# Patient Record
Sex: Male | Born: 1960 | Race: Black or African American | Hispanic: No | Marital: Married | Smoking: Current every day smoker
Health system: Southern US, Community
[De-identification: ages and names within clinical notes are randomized; demographics above are authoritative.]

## PROBLEM LIST (undated history)

## (undated) DIAGNOSIS — M549 Dorsalgia, unspecified: Secondary | ICD-10-CM

## (undated) DIAGNOSIS — G8929 Other chronic pain: Secondary | ICD-10-CM

## (undated) DIAGNOSIS — I10 Essential (primary) hypertension: Secondary | ICD-10-CM

## (undated) DIAGNOSIS — F1010 Alcohol abuse, uncomplicated: Secondary | ICD-10-CM

## (undated) DIAGNOSIS — M1990 Unspecified osteoarthritis, unspecified site: Secondary | ICD-10-CM

## (undated) DIAGNOSIS — Z9109 Other allergy status, other than to drugs and biological substances: Secondary | ICD-10-CM

## (undated) HISTORY — DX: Essential (primary) hypertension: I10

## (undated) HISTORY — PX: NO PAST SURGERIES: SHX2092

---

## 1999-08-19 ENCOUNTER — Ambulatory Visit (HOSPITAL_COMMUNITY): Admission: RE | Admit: 1999-08-19 | Discharge: 1999-08-19 | Payer: Self-pay | Admitting: Neurosurgery

## 1999-08-19 ENCOUNTER — Encounter: Payer: Self-pay | Admitting: Neurosurgery

## 1999-09-15 ENCOUNTER — Encounter: Payer: Self-pay | Admitting: Neurosurgery

## 1999-09-15 ENCOUNTER — Ambulatory Visit (HOSPITAL_COMMUNITY): Admission: RE | Admit: 1999-09-15 | Discharge: 1999-09-15 | Payer: Self-pay | Admitting: Neurosurgery

## 1999-09-29 ENCOUNTER — Encounter: Payer: Self-pay | Admitting: Neurosurgery

## 1999-09-29 ENCOUNTER — Ambulatory Visit (HOSPITAL_COMMUNITY): Admission: RE | Admit: 1999-09-29 | Discharge: 1999-09-29 | Payer: Self-pay | Admitting: Neurosurgery

## 1999-10-13 ENCOUNTER — Ambulatory Visit (HOSPITAL_COMMUNITY): Admission: RE | Admit: 1999-10-13 | Discharge: 1999-10-13 | Payer: Self-pay | Admitting: Neurosurgery

## 1999-10-13 ENCOUNTER — Encounter: Payer: Self-pay | Admitting: Neurosurgery

## 2010-04-13 ENCOUNTER — Emergency Department (HOSPITAL_COMMUNITY)
Admission: EM | Admit: 2010-04-13 | Discharge: 2010-04-13 | Payer: Self-pay | Source: Home / Self Care | Admitting: Emergency Medicine

## 2012-10-26 ENCOUNTER — Emergency Department (HOSPITAL_COMMUNITY)
Admission: EM | Admit: 2012-10-26 | Discharge: 2012-10-26 | Disposition: A | Discharge status: Home / Self Care | Payer: BC Managed Care – PPO | Attending: Emergency Medicine | Admitting: Emergency Medicine

## 2012-10-26 ENCOUNTER — Encounter (HOSPITAL_COMMUNITY): Payer: Self-pay | Admitting: *Deleted

## 2012-10-26 DIAGNOSIS — G8929 Other chronic pain: Secondary | ICD-10-CM | POA: Insufficient documentation

## 2012-10-26 DIAGNOSIS — R209 Unspecified disturbances of skin sensation: Secondary | ICD-10-CM | POA: Insufficient documentation

## 2012-10-26 DIAGNOSIS — M545 Low back pain, unspecified: Secondary | ICD-10-CM | POA: Insufficient documentation

## 2012-10-26 DIAGNOSIS — F17200 Nicotine dependence, unspecified, uncomplicated: Secondary | ICD-10-CM | POA: Insufficient documentation

## 2012-10-26 DIAGNOSIS — Z9109 Other allergy status, other than to drugs and biological substances: Secondary | ICD-10-CM | POA: Insufficient documentation

## 2012-10-26 DIAGNOSIS — M79609 Pain in unspecified limb: Secondary | ICD-10-CM | POA: Insufficient documentation

## 2012-10-26 HISTORY — DX: Dorsalgia, unspecified: M54.9

## 2012-10-26 HISTORY — DX: Other chronic pain: G89.29

## 2012-10-26 HISTORY — DX: Other allergy status, other than to drugs and biological substances: Z91.09

## 2012-10-26 MED ORDER — KETOROLAC TROMETHAMINE 60 MG/2ML IM SOLN
60.0000 mg | Freq: Once | INTRAMUSCULAR | Status: AC
  Administered 2012-10-26: 60 mg via INTRAMUSCULAR
  Filled 2012-10-26: qty 2

## 2012-10-26 MED ORDER — PREDNISONE 20 MG PO TABS: ORAL_TABLET | ORAL | Status: DC

## 2012-10-26 MED ORDER — DIAZEPAM 5 MG/ML IJ SOLN
10.0000 mg | Freq: Once | INTRAMUSCULAR | Status: AC
  Administered 2012-10-26: 10 mg via INTRAMUSCULAR
  Filled 2012-10-26: qty 2

## 2012-10-26 MED ORDER — CYCLOBENZAPRINE HCL 10 MG PO TABS: 10.0000 mg | ORAL_TABLET | Freq: Three times a day (TID) | ORAL | Status: DC | PRN

## 2012-10-26 MED ORDER — TRAMADOL HCL 50 MG PO TABS: 100.0000 mg | ORAL_TABLET | Freq: Four times a day (QID) | ORAL | Status: DC | PRN

## 2012-10-26 NOTE — ED Notes (Signed)
Chronic back pain. 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 States swelling and pain to lower back.
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
## 2013-02-18 ENCOUNTER — Other Ambulatory Visit (HOSPITAL_COMMUNITY): Payer: Self-pay | Admitting: Family Medicine

## 2013-02-18 ENCOUNTER — Ambulatory Visit (HOSPITAL_COMMUNITY)
Admission: RE | Admit: 2013-02-18 | Discharge: 2013-02-18 | Disposition: A | Discharge status: Home / Self Care | Payer: BC Managed Care – PPO | Source: Ambulatory Visit | Attending: Family Medicine | Admitting: Family Medicine

## 2013-02-18 DIAGNOSIS — M19019 Primary osteoarthritis, unspecified shoulder: Secondary | ICD-10-CM | POA: Insufficient documentation

## 2013-02-18 DIAGNOSIS — R209 Unspecified disturbances of skin sensation: Secondary | ICD-10-CM | POA: Diagnosis present

## 2013-02-18 DIAGNOSIS — M47812 Spondylosis without myelopathy or radiculopathy, cervical region: Secondary | ICD-10-CM | POA: Diagnosis not present

## 2013-02-18 DIAGNOSIS — S3992XA Unspecified injury of lower back, initial encounter: Secondary | ICD-10-CM

## 2013-02-18 DIAGNOSIS — M25519 Pain in unspecified shoulder: Secondary | ICD-10-CM | POA: Insufficient documentation

## 2013-02-18 DIAGNOSIS — M542 Cervicalgia: Secondary | ICD-10-CM | POA: Insufficient documentation

## 2013-02-18 DIAGNOSIS — M25469 Effusion, unspecified knee: Secondary | ICD-10-CM | POA: Insufficient documentation

## 2013-02-18 DIAGNOSIS — R29898 Other symptoms and signs involving the musculoskeletal system: Secondary | ICD-10-CM | POA: Insufficient documentation

## 2013-02-18 DIAGNOSIS — R200 Anesthesia of skin: Secondary | ICD-10-CM

## 2013-11-18 ENCOUNTER — Encounter (HOSPITAL_COMMUNITY): Payer: Self-pay | Admitting: Emergency Medicine

## 2013-11-18 ENCOUNTER — Emergency Department (HOSPITAL_COMMUNITY): Payer: Disability Insurance

## 2013-11-18 ENCOUNTER — Emergency Department (HOSPITAL_COMMUNITY)
Admission: EM | Admit: 2013-11-18 | Discharge: 2013-11-18 | Discharge status: Against Medical Advice | Payer: Disability Insurance | Attending: Emergency Medicine | Admitting: Emergency Medicine

## 2013-11-18 DIAGNOSIS — Z79899 Other long term (current) drug therapy: Secondary | ICD-10-CM | POA: Insufficient documentation

## 2013-11-18 DIAGNOSIS — Y9389 Activity, other specified: Secondary | ICD-10-CM | POA: Insufficient documentation

## 2013-11-18 DIAGNOSIS — Y92410 Unspecified street and highway as the place of occurrence of the external cause: Secondary | ICD-10-CM | POA: Insufficient documentation

## 2013-11-18 DIAGNOSIS — S139XXA Sprain of joints and ligaments of unspecified parts of neck, initial encounter: Secondary | ICD-10-CM | POA: Insufficient documentation

## 2013-11-18 DIAGNOSIS — IMO0002 Reserved for concepts with insufficient information to code with codable children: Secondary | ICD-10-CM | POA: Insufficient documentation

## 2013-11-18 DIAGNOSIS — S161XXA Strain of muscle, fascia and tendon at neck level, initial encounter: Secondary | ICD-10-CM

## 2013-11-18 DIAGNOSIS — Z8709 Personal history of other diseases of the respiratory system: Secondary | ICD-10-CM | POA: Insufficient documentation

## 2013-11-18 DIAGNOSIS — F17200 Nicotine dependence, unspecified, uncomplicated: Secondary | ICD-10-CM | POA: Insufficient documentation

## 2013-11-18 DIAGNOSIS — G8929 Other chronic pain: Secondary | ICD-10-CM | POA: Insufficient documentation

## 2013-11-18 MED ORDER — IBUPROFEN 800 MG PO TABS
800.0000 mg | ORAL_TABLET | Freq: Once | ORAL | Status: AC
  Administered 2013-11-18: 800 mg via ORAL
  Filled 2013-11-18: qty 1

## 2013-11-18 MED ORDER — CYCLOBENZAPRINE HCL 10 MG PO TABS
10.0000 mg | ORAL_TABLET | Freq: Once | ORAL | Status: AC
  Administered 2013-11-18: 10 mg via ORAL
  Filled 2013-11-18: qty 1

## 2013-11-18 MED ORDER — HYDROCODONE-ACETAMINOPHEN 5-325 MG PO TABS
1.0000 | ORAL_TABLET | Freq: Once | ORAL | Status: AC
  Administered 2013-11-18: 1 via ORAL
  Filled 2013-11-18: qty 1
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
## 2013-11-18 NOTE — ED Notes (Signed)
Hope spoke with pt, stated that pt was leaving and needed to sign AMA, went to get pt to sign and pt not in room and not located in waiting room as well, Kerrie BuffaloHope Neese, NP made aware

## 2013-11-18 NOTE — ED Notes (Signed)
Pt states he is not driving and family  Member is to pick him up
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
## 2013-11-19 NOTE — ED Provider Notes (Signed)
History/physical exam/procedure(s) were performed by non-physician practitioner and as supervising physician I was immediately available for consultation/collaboration. I have reviewed all notes and am in agreement with care and plan.   Hilario Quarryanielle S Ray, MD 11/19/13 1330

## 2016-04-27 ENCOUNTER — Other Ambulatory Visit (HOSPITAL_COMMUNITY): Payer: Self-pay | Admitting: Family

## 2016-04-27 ENCOUNTER — Ambulatory Visit (HOSPITAL_COMMUNITY)
Admission: RE | Admit: 2016-04-27 | Discharge: 2016-04-27 | Disposition: A | Discharge status: Home / Self Care | Payer: Self-pay | Source: Ambulatory Visit | Attending: Family | Admitting: Family

## 2016-04-27 DIAGNOSIS — R52 Pain, unspecified: Secondary | ICD-10-CM

## 2016-04-27 DIAGNOSIS — M5416 Radiculopathy, lumbar region: Secondary | ICD-10-CM | POA: Insufficient documentation

## 2016-04-27 DIAGNOSIS — M47896 Other spondylosis, lumbar region: Secondary | ICD-10-CM | POA: Insufficient documentation

## 2016-06-14 ENCOUNTER — Ambulatory Visit (INDEPENDENT_AMBULATORY_CARE_PROVIDER_SITE_OTHER): Payer: Self-pay | Admitting: Orthopaedic Surgery

## 2016-06-14 ENCOUNTER — Encounter (INDEPENDENT_AMBULATORY_CARE_PROVIDER_SITE_OTHER): Payer: Self-pay

## 2016-06-14 ENCOUNTER — Encounter (INDEPENDENT_AMBULATORY_CARE_PROVIDER_SITE_OTHER): Payer: Self-pay | Admitting: Orthopaedic Surgery

## 2016-06-14 VITALS — BP 146/73 | HR 77 | Ht 71.0 in | Wt 203.0 lb

## 2016-06-14 DIAGNOSIS — M545 Low back pain, unspecified: Secondary | ICD-10-CM

## 2016-06-14 DIAGNOSIS — G8929 Other chronic pain: Secondary | ICD-10-CM

## 2016-06-14 NOTE — Addendum Note (Signed)
Addended by: Rogers SeedsYEATTS, Montey Ebel M on: 06/14/2016 04:44 PM   Modules accepted: Orders

## 2016-06-14 NOTE — Progress Notes (Addendum)
Office Visit Note   Patient: Patrick Aguirre           Date of Birth: 1960/10/26           MRN: [id number] Visit Date: 06/14/2016              Requested by: Jerrell Belfast, FNP [address] [place], [place] [address] PCP: No PCP Per Patient   Assessment & Plan: Visit Diagnoses:  1. Chronic bilateral low back pain without sciatica     Plan: Patient's having pain when he stands he states he can walk about half miles before he has  to stop. Has pain when he isn't doing prolonged standing. Legs get weak. Is a long-term smoker but has good pulses. Previous history of epidural steroid injections back in 2001 lumbar 4 L4-5 disc protrusion. 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 I recommend proceeding with a lumbar MRI scan FOLLOW-up after scan for review. He could be seen by his primary care to get some lab work to check the an arthritis panel including a uric acid.  Follow-Up Instructions: No Follow-up on file.   Orders:  No orders of the defined types were placed in this encounter.  No orders of the defined types were placed in this encounter.     Procedures: No procedures performed   Clinical Data: No additional findings.   Subjective: Chief Complaint  Patient presents with  . Lower Back - Pain    Patient is here with complaints of chronic low back pain. He states that the pain radiates into both legs to the backs of his knees. If he walks a long ways, the pain goes further down to his feet. He sometimes has numbness and tingling in his feet. He fell off of a scaffold about 20 years ago. He has taken Prednisone from his PCP with no relief. He is now taking Gabapentin and Diclofenac with not a lot of relief. He has had x-rays made at [facility].     Review of Systems  Constitutional: Negative for chills and diaphoresis.  HENT: Negative for ear discharge, ear pain and nosebleeds.   Eyes: Negative for discharge and visual disturbance.    Respiratory: Negative for cough, choking and shortness of breath.   Cardiovascular: Negative for chest pain and palpitations.  Gastrointestinal: Negative for abdominal distention and abdominal pain.  Endocrine: Negative for cold intolerance and heat intolerance.  Genitourinary: Negative for flank pain and hematuria.  Musculoskeletal: Positive for back pain.       Chronic back pain for years previous epidurals 2001. History of on-the-job injury 20 years ago proximally were board fell and landed on his back. He did not have any fractures at that time. He gets relief if he leans over grocery cart when he is walking and can walk further.  Skin: Negative for rash and wound.  Neurological: Negative for seizures and speech difficulty.  Hematological: Negative for adenopathy. Does not bruise/bleed easily.  Psychiatric/Behavioral: Negative for agitation and suicidal ideas.     Objective: Vital Signs: BP (!) 146/73   Pulse 77   Ht 5\' 11"  (1.803 m)   Wt 203 lb (92.1 kg)   BMI 28.31 kg/m   Physical Exam  Constitutional: He is oriented to person, place, and time. He appears well-developed and well-nourished.  HENT:  Head: Normocephalic and atraumatic.  Eyes: EOM are normal. Pupils are equal, round, and reactive to light.  Neck: No tracheal deviation present. No  thyromegaly present.  Cardiovascular: Normal rate.   Pulmonary/Chest: Effort normal. He has no wheezes.  Abdominal: Soft. Bowel sounds are normal.  Musculoskeletal:  She has negative straight leg raising. Hip range of motion is normal. Anterior tib ankle dorsiflexion plantar flexion is strong reflexes are 2+ and symmetrical. Posterior tibial pulses are 2+ right and left. Negative Faber test.  Neurological: He is alert and oriented to person, place, and time.  Skin: Skin is warm and dry. Capillary refill takes less than 2 seconds.  Psychiatric: He has a normal mood and affect. His behavior is normal. Judgment and thought content normal.     Ortho Exam  Specialty Comments:  No specialty comments available.  Imaging: No results found.   PMFS History: There are no active problems to display for this patient.  Past Medical History:  Diagnosis Date  . Chronic back pain   . Environmental allergies     No family history on file.  No past surgical history on file. Social History   Occupational History  . Not on file.   Social History Main Topics  . Smoking status: Current Every Day Smoker  . Smokeless tobacco: Never Used  . Alcohol use Yes     Comment: 2 beers daily  . Drug use: No  . Sexual activity: Not on file

## 2016-06-17 ENCOUNTER — Encounter (INDEPENDENT_AMBULATORY_CARE_PROVIDER_SITE_OTHER): Payer: Self-pay | Admitting: *Deleted

## 2016-06-22 ENCOUNTER — Ambulatory Visit (HOSPITAL_COMMUNITY)
Admission: RE | Admit: 2016-06-22 | Discharge: 2016-06-22 | Disposition: A | Discharge status: Home / Self Care | Payer: Self-pay | Source: Ambulatory Visit | Attending: Orthopaedic Surgery | Admitting: Orthopaedic Surgery

## 2016-06-22 DIAGNOSIS — M48061 Spinal stenosis, lumbar region without neurogenic claudication: Secondary | ICD-10-CM | POA: Insufficient documentation

## 2016-06-22 DIAGNOSIS — G8929 Other chronic pain: Secondary | ICD-10-CM | POA: Insufficient documentation

## 2016-06-22 DIAGNOSIS — M545 Low back pain, unspecified: Secondary | ICD-10-CM

## 2016-06-22 DIAGNOSIS — M5136 Other intervertebral disc degeneration, lumbar region: Secondary | ICD-10-CM | POA: Insufficient documentation

## 2016-06-22 DIAGNOSIS — M5126 Other intervertebral disc displacement, lumbar region: Secondary | ICD-10-CM | POA: Insufficient documentation

## 2016-06-23 ENCOUNTER — Telehealth (INDEPENDENT_AMBULATORY_CARE_PROVIDER_SITE_OTHER): Payer: Self-pay | Admitting: Orthopaedic Surgery

## 2016-06-23 NOTE — Telephone Encounter (Signed)
HAD A MRI DONE YESTERDAY ON HIS BACK AND WAS WANTING TO SET UP AN APPOINTMENT TO REVIEW THE RESULTS. CB # [phone]

## 2016-06-23 NOTE — Telephone Encounter (Signed)
Can you see if patient can come at 2:45 tomorrow afternoon and overbook the 15 min IME slot? He is going to need surgery. Otherwise, I cannot get him in. Thanks.

## 2016-06-23 NOTE — Telephone Encounter (Signed)
Spoke with daughter and sch'd appt for 06/24/16 @ 2:45

## 2016-06-24 ENCOUNTER — Encounter (INDEPENDENT_AMBULATORY_CARE_PROVIDER_SITE_OTHER): Payer: Self-pay | Admitting: Orthopaedic Surgery

## 2016-06-24 ENCOUNTER — Ambulatory Visit (INDEPENDENT_AMBULATORY_CARE_PROVIDER_SITE_OTHER): Payer: Self-pay | Admitting: Orthopaedic Surgery

## 2016-06-24 VITALS — BP 155/75 | HR 64 | Ht 71.0 in | Wt 203.0 lb

## 2016-06-24 DIAGNOSIS — M545 Low back pain: Secondary | ICD-10-CM

## 2016-06-24 DIAGNOSIS — G8929 Other chronic pain: Secondary | ICD-10-CM

## 2016-06-24 NOTE — Progress Notes (Signed)
Office Visit Note   Patient: Patrick Aguirre           Date of Birth: 01-29-1961           MRN: [id number] Visit Date: 06/24/2016              Requested by: No referring provider defined for this encounter. PCP: No PCP Per Patient   Assessment & Plan: Visit Diagnoses:  1. Chronic right-sided low back pain, with sciatica presence unspecified     Plan: Patient has extruded fragment L4-5 right paracentral with compression. We discussed smoking cessation. Options for treatment epidural versus the microdiscectomy discussed in detail. Patient states he like to proceed with operative treatment. He's been on tramadol, prednisone, Flexeril, Ultram, Voltaren without relief. He's had several visits to the emergency room. Back pain has been present for several years but in the last 5 months of gotten increasingly severe with more right leg weakness. He states he has difficulty going up stairs or climbing a ladder which is part of what he normally would do when he works. Operative plan with the microdiscectomy overnight stay. He should be able to resume work activities after 6-7 weeks postop. We discussed a walking program risks of disc recurrence at 5-10%. Questions was then answered he understands and states he like to proceed.  Follow-Up Instructions: No Follow-up on file.   Orders:  No orders of the defined types were placed in this encounter.  No orders of the defined types were placed in this encounter.     Procedures: No procedures performed   Clinical Data: No additional findings.   Subjective: Chief Complaint  Patient presents with  . Lower Back - Follow-up  . Follow-up    Patient is here to discuss MRI results of Lumbar Spine.  History significant he have pain in his back after he stands for long periods and states after he walks about half mile has increased pain and has to stop. Past history of epidurals 2001 for L4-5 disc protrusion. 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  Review of Systems 14 point review systems updated and is unchanged from 06/14/2016. History of follow-up for scaffold 20 years ago. He said the gabapentin diclofenac and prednisone for his back from his PCP without relief.   Objective: Vital Signs: BP (!) 155/75   Pulse 64   Ht 5\' 11"  (1.803 m)   Wt 203 lb (92.1 kg)   BMI 28.31 kg/m   Physical Exam  Constitutional: He is oriented to person, place, and time. He appears well-developed and well-nourished.  HENT:  Head: Normocephalic and atraumatic.  Eyes: EOM are normal. Pupils are equal, round, and reactive to light.  Neck: No tracheal deviation present. No thyromegaly present.  Cardiovascular: Normal rate.   Pulmonary/Chest: Effort normal. He has no wheezes.  Abdominal: Soft. Bowel sounds are normal.  Musculoskeletal:  Pelvis is level patient has normal heel toe gait anterior tib EHL is strong negative straight leg raising to 90. Mild sciatic notch tenderness on the right. No rash or exposed skin no lymphadenopathy. Ankle dorsiflexion plantar flexion has good strength. With single stance toe raising on the right fatigue without after 2 calf pumps. Opposite left he can do 20 easily. Decreased sensation over the lateral aspect of the foot also dorsum. Positive papular compression test. Negative straight leg raising on the left.  Neurological: He is alert and oriented to person, place, and time.  Skin:  Skin is warm and dry. Capillary refill takes less than 2 seconds.  Psychiatric: He has a normal mood and affect. His behavior is normal. Judgment and thought content normal.    Ortho Exam  Specialty Comments:  No specialty comments available.  Imaging: No results found. Show images for MR Lumbar Spine w/o contrast  Study Result   CLINICAL DATA:  Initial evaluation for persistent back pain. No known injury.  EXAM: MRI LUMBAR SPINE WITHOUT  CONTRAST  TECHNIQUE: Multiplanar, multisequence MR imaging of the lumbar spine was performed. No intravenous contrast was administered.  COMPARISON:  Prior radiograph from 04/27/2016.  FINDINGS: Segmentation: Normal segmentation. Lowest well-formed disc is labeled the L5-S1 level.  Alignment: Vertebral bodies normally aligned with preservation of the normal lumbar lordosis. No listhesis.  Vertebrae: Vertebral body heights are maintained. No evidence for acute or chronic fracture. Signal intensity within the vertebral body bone marrow is heterogeneous. Probable atypical hemangioma noted within the L4 vertebral body. No other discrete osseous lesions identified.  Conus medullaris: Extends to the L1 level and appears normal.  Paraspinal and other soft tissues: Paraspinous soft tissues within normal limits. Visualized visceral structures are unremarkable.  Disc levels:  L1-2:  Negative.  L2-3: Diffuse degenerative disc bulge with disc desiccation and intervertebral disc space narrowing. Disc bulging slightly eccentric to the left without focal disc protrusion. No significant canal stenosis. Mild left foraminal narrowing. Right neural foramen remains widely patent.  L3-4: Diffuse degenerative disc bulge with disc desiccation. Disc bulging slightly eccentric to the right without focal disc protrusion. Mild ligamentum flavum hypertrophy. No significant canal stenosis. Moderate bilateral foraminal stenosis.  L4-5: Diffuse degenerative disc bulge with disc desiccation. There is a superimposed right subarticular disc extrusion extending into the right lateral recess (series 3, image 8). This could potentially effect either the right L5 or S1 nerve roots. Inferior migration measures approximately 8 mm inferior to the parent L4-5 disc space. No significant canal stenosis. Superimposed mild facet and ligamentum flavum hypertrophy. Moderate bilateral  foraminal stenosis.  L5-S1: No disc bulge or disc protrusion. Mild bilateral facet arthrosis. No stenosis.  IMPRESSION: 1. Right subarticular disc extrusion with inferior migration, extending into the right lateral recess. This could potentially effect either the right L5 or S1 nerve roots. 2. Degenerative disc bulge at L3-4 and L4-5 with resultant moderate bilateral foraminal stenosis 3. Degenerative disc bulge at L2-3 with resultant mild left foraminal narrowing.   Electronically Signed   By: Rise MuBenjamin  McClintock M.D.   On: 06/22/2016 20:32      PMFS History: There are no active problems to display for this patient.  Past Medical History:  Diagnosis Date  . Chronic back pain   . Environmental allergies     No family history on file.  No past surgical history on file. Social History   Occupational History  . Not on file.   Social History Main Topics  . Smoking status: Current Every Day Smoker  . Smokeless tobacco: Never Used  . Alcohol use Yes     Comment: 2 beers daily  . Drug use: No  . Sexual activity: Not on file

## 2016-07-15 ENCOUNTER — Other Ambulatory Visit (INDEPENDENT_AMBULATORY_CARE_PROVIDER_SITE_OTHER): Payer: Self-pay | Admitting: Orthopaedic Surgery

## 2016-07-15 DIAGNOSIS — M5126 Other intervertebral disc displacement, lumbar region: Secondary | ICD-10-CM

## 2016-07-19 ENCOUNTER — Encounter (HOSPITAL_COMMUNITY)
Admission: RE | Admit: 2016-07-19 | Discharge: 2016-07-19 | Disposition: A | Discharge status: Home / Self Care | Payer: Self-pay | Source: Ambulatory Visit | Attending: Orthopaedic Surgery | Admitting: Orthopaedic Surgery

## 2016-07-19 ENCOUNTER — Encounter (HOSPITAL_COMMUNITY): Payer: Self-pay

## 2016-07-19 DIAGNOSIS — Z01818 Encounter for other preprocedural examination: Secondary | ICD-10-CM | POA: Insufficient documentation

## 2016-07-19 DIAGNOSIS — Z01812 Encounter for preprocedural laboratory examination: Secondary | ICD-10-CM | POA: Insufficient documentation

## 2016-07-19 DIAGNOSIS — R9431 Abnormal electrocardiogram [ECG] [EKG]: Secondary | ICD-10-CM | POA: Insufficient documentation

## 2016-07-19 DIAGNOSIS — M5126 Other intervertebral disc displacement, lumbar region: Secondary | ICD-10-CM | POA: Insufficient documentation

## 2016-07-19 DIAGNOSIS — Z01810 Encounter for preprocedural cardiovascular examination: Secondary | ICD-10-CM | POA: Insufficient documentation

## 2016-07-19 DIAGNOSIS — R918 Other nonspecific abnormal finding of lung field: Secondary | ICD-10-CM | POA: Insufficient documentation

## 2016-07-19 HISTORY — DX: Unspecified osteoarthritis, unspecified site: M19.90

## 2016-07-19 LAB — CBC
HEMATOCRIT: 42.7 % (ref 39.0–52.0)
Hemoglobin: 14.8 g/dL (ref 13.0–17.0)
MCH: 32 pg (ref 26.0–34.0)
MCHC: 34.7 g/dL (ref 30.0–36.0)
MCV: 92.2 fL (ref 78.0–100.0)
Platelets: 261 10*3/uL (ref 150–400)
RBC: 4.63 MIL/uL (ref 4.22–5.81)
RDW: 12.1 % (ref 11.5–15.5)
WBC: 7.8 10*3/uL (ref 4.0–10.5)

## 2016-07-19 LAB — COMPREHENSIVE METABOLIC PANEL
ALT: 55 U/L (ref 17–63)
AST: 88 U/L — AB (ref 15–41)
Albumin: 4.7 g/dL (ref 3.5–5.0)
Alkaline Phosphatase: 60 U/L (ref 38–126)
Anion gap: 12 (ref 5–15)
BILIRUBIN TOTAL: 1.3 mg/dL — AB (ref 0.3–1.2)
BUN: 8 mg/dL (ref 6–20)
CO2: 26 mmol/L (ref 22–32)
CREATININE: 1.09 mg/dL (ref 0.61–1.24)
Calcium: 9.8 mg/dL (ref 8.9–10.3)
Chloride: 102 mmol/L (ref 101–111)
Glucose, Bld: 97 mg/dL (ref 65–99)
POTASSIUM: 4.2 mmol/L (ref 3.5–5.1)
Sodium: 140 mmol/L (ref 135–145)
TOTAL PROTEIN: 9 g/dL — AB (ref 6.5–8.1)

## 2016-07-19 LAB — SURGICAL PCR SCREEN
MRSA, PCR: NEGATIVE
Staphylococcus aureus: NEGATIVE

## 2016-07-19 LAB — URIC ACID: Uric Acid, Serum: 7.7 mg/dL — ABNORMAL HIGH (ref 4.4–7.6)

## 2016-07-19 MED ORDER — CHLORHEXIDINE GLUCONATE 4 % EX LIQD: 60.0000 mL | Freq: Once | CUTANEOUS | Status: DC

## 2016-07-19 NOTE — Progress Notes (Signed)
Anesthesia Chart Review:  Pt is a 56 year old male scheduled for right L4-5 microdiscectomy on 07/20/2016 with Annell GreeningMark Yates, MD  - Pt does not have a PCP.   BP (!) 158/84   Pulse 79   Temp 36.9 C   Resp 20   Ht 6\' 1"  (1.854 m)   Wt 211 lb 8 oz (95.9 kg)   SpO2 97%   BMI 27.90 kg/m    PMH includes:  Back pain.  Current smoker. BMI 28  No medications reported.   Preoperative labs reviewed.  AST 88, ALT 55  CXR 07/19/16: Hyperinflation.  No active cardiopulmonary disease.  EKG 07/19/16: NSR. T wave abnormality, consider anterolateral ischemia. No prior tracing for comparison.   Reviewed case with Dr. Aleene DavidsonE. Fitzgerald.  No CV sx documented at PAT.   If no changes, I anticipate pt can proceed with surgery as scheduled.   Rica Mastngela Dayshon Roback, FNP-BC [facility] Phone: [phone] 07/19/2016 1:42 PM

## 2016-07-19 NOTE — Progress Notes (Signed)
Odella Aquas, RN Registered Nurse Incomplete   Pre-Procedure Instructions Date of Service: 07/18/2016 10:59 AM                     [facility]            07/18/2016                          [facility] - [place], [place] - [address] [place] [address] Phone: [phone] Fax: [phone]              Your procedure is scheduled on Wed, Feb 28 @ 3;05 PM            Report to [facility] Admitting at 1:00 PM            Call this number if you have problems the morning of surgery:            [phone]             Remember:            Do not eat food or drink liquids after midnight.            Take these medicines the morning of surgery with A SIP OF WATER              No Goody's,BC's,Aleve,Advil,Motrin,Ibuprofen,Herbal Medications or Fish Oil.             Do not wear jewelry.            Do not wear lotions, powders,colognes, or deoderant.            Men may shave face and neck.            Do not bring valuables to the hospital.            [facility] is not responsible for any belongings or valuables.  Contacts, dentures or bridgework may not be worn into surgery.  Leave your suitcase in the car.  After surgery it may be brought to your room.  For patients admitted to the hospital, discharge time will be determined by your treatment team.  Patients discharged the day of surgery will not be allowed to drive home.    [facility]  Before surgery, you can play an important role.  Because skin is not sterile, your skin needs to be as free of germs as possible.  You can reduce the number of germs on you skin by washing with CHG (chlorahexidine gluconate) soap before surgery.  CHG is an antiseptic cleaner which kills germs and bonds with the skin to continue killing germs even after washing.  Please DO NOT use if you have an allergy to CHG or antibacterial soaps.  If your  skin becomes reddened/irritated stop using the CHG and inform your nurse when you arrive at Short Stay.  Do not shave (including legs and underarms) for at least 48 hours prior to the first CHG shower.  You may shave your face.  Please follow these instructions carefully:             1.  Shower with CHG Soap the night before surgery and the  morning of Surgery.            2.  If you choose to wash your hair, wash your hair first as usual with your                normal shampoo.            3.  After you shampoo, rinse your hair and body thoroughly to remove the                             Shampoo.            4.  Use CHG as you would any other liquid soap.  You can apply chg directly               to the skin and wash gently with scrungie or a clean washcloth.            5.  Apply the CHG Soap to your body ONLY FROM THE NECK DOWN.          Do not use on open wounds or open sores.  Avoid contact with your eyes,             ears, mouth and genitals (private parts).  Wash genitals (private parts)        with your normal soap.            6.  Wash thoroughly, paying special attention to the area where your surgery               will be performed.            7.  Thoroughly rinse your body with warm water from the neck down.            8.  DO NOT shower/wash with your normal soap after using and rinsing off                the CHG Soap.            9.  Pat yourself dry with a clean towel.            10.  Wear clean pajamas.            11.  Place clean sheets on your bed the night of your first shower and do not           sleep with pets.  Day of Surgery  Do not apply any lotions/deoderants the morning of surgery.  Please wear clean clothes to the hospital/surgery center.    Please read over the following fact sheets that you were given. Pain Booklet, Coughing and Deep Breathing, MRSA Information and Surgical Site Infection Prevention

## 2016-07-19 NOTE — Progress Notes (Signed)
PCP: pt denies  Cardiologist: pt denies  EKG: pt denies ever  Stress test: pt denies ever  ECHO: pt denies ever  Cardiac Cath: pt denies ever  Chest x-ray:pt denies past year 

## 2016-07-20 ENCOUNTER — Ambulatory Visit (HOSPITAL_COMMUNITY): Payer: Self-pay | Admitting: Anesthesiology

## 2016-07-20 ENCOUNTER — Other Ambulatory Visit (HOSPITAL_COMMUNITY): Payer: Self-pay | Admitting: *Deleted

## 2016-07-20 ENCOUNTER — Encounter (HOSPITAL_COMMUNITY): Payer: Self-pay | Admitting: *Deleted

## 2016-07-20 ENCOUNTER — Ambulatory Visit (HOSPITAL_COMMUNITY): Payer: Self-pay | Admitting: Emergency Medicine

## 2016-07-20 ENCOUNTER — Encounter (HOSPITAL_COMMUNITY)
Admission: RE | Disposition: A | Discharge status: Home / Self Care | Payer: Self-pay | Source: Ambulatory Visit | Attending: Orthopaedic Surgery

## 2016-07-20 ENCOUNTER — Ambulatory Visit (HOSPITAL_COMMUNITY): Payer: Self-pay

## 2016-07-20 ENCOUNTER — Observation Stay (HOSPITAL_COMMUNITY)
Admission: RE | Admit: 2016-07-20 | Discharge: 2016-07-21 | Disposition: A | Discharge status: Home / Self Care | Payer: Self-pay | Source: Ambulatory Visit | Attending: Orthopaedic Surgery | Admitting: Orthopaedic Surgery

## 2016-07-20 DIAGNOSIS — Z419 Encounter for procedure for purposes other than remedying health state, unspecified: Secondary | ICD-10-CM

## 2016-07-20 DIAGNOSIS — M5126 Other intervertebral disc displacement, lumbar region: Secondary | ICD-10-CM | POA: Diagnosis present

## 2016-07-20 DIAGNOSIS — F17200 Nicotine dependence, unspecified, uncomplicated: Secondary | ICD-10-CM | POA: Insufficient documentation

## 2016-07-20 DIAGNOSIS — M1990 Unspecified osteoarthritis, unspecified site: Secondary | ICD-10-CM | POA: Insufficient documentation

## 2016-07-20 DIAGNOSIS — Z9889 Other specified postprocedural states: Secondary | ICD-10-CM | POA: Insufficient documentation

## 2016-07-20 DIAGNOSIS — M48061 Spinal stenosis, lumbar region without neurogenic claudication: Secondary | ICD-10-CM | POA: Insufficient documentation

## 2016-07-20 DIAGNOSIS — M545 Low back pain: Secondary | ICD-10-CM | POA: Insufficient documentation

## 2016-07-20 DIAGNOSIS — M79604 Pain in right leg: Secondary | ICD-10-CM | POA: Insufficient documentation

## 2016-07-20 DIAGNOSIS — G8929 Other chronic pain: Secondary | ICD-10-CM | POA: Insufficient documentation

## 2016-07-20 DIAGNOSIS — M5116 Intervertebral disc disorders with radiculopathy, lumbar region: Principal | ICD-10-CM | POA: Insufficient documentation

## 2016-07-20 HISTORY — PX: LUMBAR LAMINECTOMY/DECOMPRESSION MICRODISCECTOMY: SHX5026

## 2016-07-20 SURGERY — LUMBAR LAMINECTOMY/DECOMPRESSION MICRODISCECTOMY
Anesthesia: General | Site: Back

## 2016-07-20 MED ORDER — MIDAZOLAM HCL 5 MG/5ML IJ SOLN
INTRAMUSCULAR | Status: DC | PRN
  Administered 2016-07-20: 2 mg via INTRAVENOUS

## 2016-07-20 MED ORDER — DOCUSATE SODIUM 100 MG PO CAPS
100.0000 mg | ORAL_CAPSULE | Freq: Two times a day (BID) | ORAL | Status: DC
  Administered 2016-07-20 – 2016-07-21 (×2): 100 mg via ORAL
  Filled 2016-07-20 (×2): qty 1

## 2016-07-20 MED ORDER — CEFAZOLIN SODIUM-DEXTROSE 2-4 GM/100ML-% IV SOLN
INTRAVENOUS | Status: AC
  Filled 2016-07-20: qty 100

## 2016-07-20 MED ORDER — FENTANYL CITRATE (PF) 100 MCG/2ML IJ SOLN
INTRAMUSCULAR | Status: DC | PRN
  Administered 2016-07-20 (×2): 100 ug via INTRAVENOUS
  Administered 2016-07-20 (×2): 50 ug via INTRAVENOUS

## 2016-07-20 MED ORDER — SODIUM CHLORIDE 0.9% FLUSH
3.0000 mL | Freq: Two times a day (BID) | INTRAVENOUS | Status: DC
  Administered 2016-07-21: 3 mL via INTRAVENOUS

## 2016-07-20 MED ORDER — FENTANYL CITRATE (PF) 100 MCG/2ML IJ SOLN
INTRAMUSCULAR | Status: AC
  Filled 2016-07-20: qty 4

## 2016-07-20 MED ORDER — PHENYLEPHRINE 40 MCG/ML (10ML) SYRINGE FOR IV PUSH (FOR BLOOD PRESSURE SUPPORT)
PREFILLED_SYRINGE | INTRAVENOUS | Status: AC
  Filled 2016-07-20: qty 10

## 2016-07-20 MED ORDER — PNEUMOCOCCAL VAC POLYVALENT 25 MCG/0.5ML IJ INJ
0.5000 mL | INJECTION | INTRAMUSCULAR | Status: AC
  Administered 2016-07-21: 0.5 mL via INTRAMUSCULAR
  Filled 2016-07-20: qty 0.5

## 2016-07-20 MED ORDER — ONDANSETRON HCL 4 MG/2ML IJ SOLN: 4.0000 mg | Freq: Four times a day (QID) | INTRAMUSCULAR | Status: DC | PRN

## 2016-07-20 MED ORDER — 0.9 % SODIUM CHLORIDE (POUR BTL) OPTIME
TOPICAL | Status: DC | PRN
  Administered 2016-07-20: 1000 mL

## 2016-07-20 MED ORDER — MENTHOL 3 MG MT LOZG: 1.0000 | LOZENGE | OROMUCOSAL | Status: DC | PRN

## 2016-07-20 MED ORDER — OXYCODONE HCL 5 MG/5ML PO SOLN: 5.0000 mg | Freq: Once | ORAL | Status: DC | PRN

## 2016-07-20 MED ORDER — SUCCINYLCHOLINE CHLORIDE 20 MG/ML IJ SOLN
INTRAMUSCULAR | Status: DC | PRN
  Administered 2016-07-20: 120 mg via INTRAVENOUS

## 2016-07-20 MED ORDER — POLYETHYLENE GLYCOL 3350 17 G PO PACK: 17.0000 g | PACK | Freq: Every day | ORAL | Status: DC | PRN

## 2016-07-20 MED ORDER — LIDOCAINE 2% (20 MG/ML) 5 ML SYRINGE
INTRAMUSCULAR | Status: AC
  Filled 2016-07-20: qty 25

## 2016-07-20 MED ORDER — OXYCODONE HCL 5 MG PO TABS
ORAL_TABLET | ORAL | Status: AC
  Administered 2016-07-20: 10 mg via ORAL
  Filled 2016-07-20: qty 2

## 2016-07-20 MED ORDER — OXYCODONE HCL 5 MG PO TABS: 5.0000 mg | ORAL_TABLET | Freq: Once | ORAL | Status: DC | PRN

## 2016-07-20 MED ORDER — INFLUENZA VAC SPLIT QUAD 0.5 ML IM SUSY
0.5000 mL | PREFILLED_SYRINGE | INTRAMUSCULAR | Status: AC
  Administered 2016-07-21: 0.5 mL via INTRAMUSCULAR
  Filled 2016-07-20: qty 0.5

## 2016-07-20 MED ORDER — ONDANSETRON HCL 4 MG/2ML IJ SOLN
INTRAMUSCULAR | Status: AC
Start: 2016-07-20 — End: 2016-07-20
  Filled 2016-07-20: qty 2

## 2016-07-20 MED ORDER — LACTATED RINGERS IV SOLN
INTRAVENOUS | Status: DC
  Administered 2016-07-20 (×2): via INTRAVENOUS

## 2016-07-20 MED ORDER — ACETAMINOPHEN 325 MG PO TABS: 650.0000 mg | ORAL_TABLET | Freq: Four times a day (QID) | ORAL | Status: DC | PRN

## 2016-07-20 MED ORDER — OXYCODONE HCL 5 MG PO TABS
5.0000 mg | ORAL_TABLET | ORAL | Status: DC | PRN
  Administered 2016-07-20 – 2016-07-21 (×3): 10 mg via ORAL
  Filled 2016-07-20 (×2): qty 2

## 2016-07-20 MED ORDER — EPHEDRINE 5 MG/ML INJ
INTRAVENOUS | Status: AC
Start: 2016-07-20 — End: 2016-07-20
  Filled 2016-07-20: qty 10

## 2016-07-20 MED ORDER — PHENOL 1.4 % MT LIQD: 1.0000 | OROMUCOSAL | Status: DC | PRN

## 2016-07-20 MED ORDER — SUGAMMADEX SODIUM 200 MG/2ML IV SOLN
INTRAVENOUS | Status: DC | PRN
  Administered 2016-07-20: 200 mg via INTRAVENOUS

## 2016-07-20 MED ORDER — PROPOFOL 10 MG/ML IV BOLUS
INTRAVENOUS | Status: AC
  Filled 2016-07-20: qty 20

## 2016-07-20 MED ORDER — PHENYLEPHRINE HCL 10 MG/ML IJ SOLN
INTRAMUSCULAR | Status: DC | PRN
  Administered 2016-07-20: 60 ug via INTRAVENOUS

## 2016-07-20 MED ORDER — SODIUM CHLORIDE 0.9 % IV SOLN: 250.0000 mL | INTRAVENOUS | Status: DC

## 2016-07-20 MED ORDER — ACETAMINOPHEN 650 MG RE SUPP: 650.0000 mg | RECTAL | Status: DC | PRN

## 2016-07-20 MED ORDER — ROCURONIUM BROMIDE 50 MG/5ML IV SOSY
PREFILLED_SYRINGE | INTRAVENOUS | Status: AC
Start: 2016-07-20 — End: 2016-07-20
  Filled 2016-07-20: qty 5

## 2016-07-20 MED ORDER — ONDANSETRON HCL 4 MG/2ML IJ SOLN
INTRAMUSCULAR | Status: DC | PRN
  Administered 2016-07-20: 4 mg via INTRAVENOUS

## 2016-07-20 MED ORDER — CEFAZOLIN SODIUM-DEXTROSE 2-4 GM/100ML-% IV SOLN
2.0000 g | INTRAVENOUS | Status: AC
  Administered 2016-07-20: 2 g via INTRAVENOUS

## 2016-07-20 MED ORDER — BUPIVACAINE HCL (PF) 0.25 % IJ SOLN
INTRAMUSCULAR | Status: DC | PRN
  Administered 2016-07-20: 10 mL

## 2016-07-20 MED ORDER — MIDAZOLAM HCL 2 MG/2ML IJ SOLN
INTRAMUSCULAR | Status: AC
  Filled 2016-07-20: qty 2

## 2016-07-20 MED ORDER — SUGAMMADEX SODIUM 200 MG/2ML IV SOLN
INTRAVENOUS | Status: AC
  Filled 2016-07-20: qty 2

## 2016-07-20 MED ORDER — SODIUM CHLORIDE 0.9% FLUSH: 3.0000 mL | INTRAVENOUS | Status: DC | PRN

## 2016-07-20 MED ORDER — LIDOCAINE HCL (CARDIAC) 20 MG/ML IV SOLN
INTRAVENOUS | Status: DC | PRN
  Administered 2016-07-20: 100 mg via INTRATRACHEAL

## 2016-07-20 MED ORDER — SODIUM CHLORIDE 0.9 % IV SOLN
INTRAVENOUS | Status: DC
  Administered 2016-07-20: 18:00:00 via INTRAVENOUS

## 2016-07-20 MED ORDER — DEXAMETHASONE SODIUM PHOSPHATE 10 MG/ML IJ SOLN
INTRAMUSCULAR | Status: DC | PRN
  Administered 2016-07-20: 10 mg via INTRAVENOUS

## 2016-07-20 MED ORDER — ONDANSETRON HCL 4 MG/2ML IJ SOLN: 4.0000 mg | INTRAMUSCULAR | Status: DC | PRN

## 2016-07-20 MED ORDER — ACETAMINOPHEN 325 MG PO TABS: 650.0000 mg | ORAL_TABLET | ORAL | Status: DC | PRN

## 2016-07-20 MED ORDER — FENTANYL CITRATE (PF) 100 MCG/2ML IJ SOLN
INTRAMUSCULAR | Status: AC
  Filled 2016-07-20: qty 2

## 2016-07-20 MED ORDER — HYDROMORPHONE HCL 1 MG/ML IJ SOLN
0.5000 mg | INTRAMUSCULAR | Status: DC | PRN
  Administered 2016-07-20: 0.5 mg via INTRAVENOUS

## 2016-07-20 MED ORDER — HYDROMORPHONE HCL 1 MG/ML IJ SOLN
INTRAMUSCULAR | Status: AC
  Administered 2016-07-20: 0.5 mg via INTRAVENOUS
  Filled 2016-07-20: qty 1

## 2016-07-20 MED ORDER — ACETAMINOPHEN 650 MG RE SUPP
650.0000 mg | Freq: Four times a day (QID) | RECTAL | Status: DC | PRN
Start: 2016-07-20 — End: 2016-07-20

## 2016-07-20 MED ORDER — ROCURONIUM BROMIDE 100 MG/10ML IV SOLN
INTRAVENOUS | Status: DC | PRN
  Administered 2016-07-20: 50 mg via INTRAVENOUS

## 2016-07-20 MED ORDER — BUPIVACAINE HCL (PF) 0.25 % IJ SOLN
INTRAMUSCULAR | Status: AC
  Filled 2016-07-20: qty 30

## 2016-07-20 MED ORDER — HYDROMORPHONE HCL 2 MG/ML IJ SOLN: 0.5000 mg | INTRAMUSCULAR | Status: DC | PRN

## 2016-07-20 MED ORDER — ARTIFICIAL TEARS OP OINT
TOPICAL_OINTMENT | OPHTHALMIC | Status: AC
  Filled 2016-07-20: qty 3.5

## 2016-07-20 MED ORDER — HYDROMORPHONE HCL 1 MG/ML IJ SOLN
0.2500 mg | INTRAMUSCULAR | Status: DC | PRN
  Administered 2016-07-20: 0.5 mg via INTRAVENOUS

## 2016-07-20 SURGICAL SUPPLY — 46 items
BENZOIN TINCTURE PRP APPL 2/3 (GAUZE/BANDAGES/DRESSINGS) ×3 IMPLANT
BUR ROUND FLUTED 4 SOFT TCH (BURR) ×2 IMPLANT
BUR ROUND FLUTED 4MM SOFT TCH (BURR) ×1
CANISTER SUCT 3000ML PPV (MISCELLANEOUS) ×3 IMPLANT
CLOSURE STERI-STRIP 1/2X4 (GAUZE/BANDAGES/DRESSINGS) ×1
CLSR STERI-STRIP ANTIMIC 1/2X4 (GAUZE/BANDAGES/DRESSINGS) ×2 IMPLANT
COVER SURGICAL LIGHT HANDLE (MISCELLANEOUS) ×3 IMPLANT
DECANTER SPIKE VIAL GLASS SM (MISCELLANEOUS) ×6 IMPLANT
DERMABOND ADVANCED (GAUZE/BANDAGES/DRESSINGS) ×2
DERMABOND ADVANCED .7 DNX12 (GAUZE/BANDAGES/DRESSINGS) ×1 IMPLANT
DRAPE MICROSCOPE LEICA (MISCELLANEOUS) ×3 IMPLANT
DRAPE PROXIMA HALF (DRAPES) ×6 IMPLANT
DRAPE SURG 17X23 STRL (DRAPES) ×3 IMPLANT
DRSG MEPILEX BORDER 4X4 (GAUZE/BANDAGES/DRESSINGS) ×3 IMPLANT
DURAPREP 26ML APPLICATOR (WOUND CARE) ×3 IMPLANT
ELECT REM PT RETURN 9FT ADLT (ELECTROSURGICAL) ×3
ELECTRODE REM PT RTRN 9FT ADLT (ELECTROSURGICAL) ×1 IMPLANT
GLOVE BIOGEL PI IND STRL 8 (GLOVE) ×2 IMPLANT
GLOVE BIOGEL PI INDICATOR 8 (GLOVE) ×4
GLOVE ORTHO TXT STRL SZ7.5 (GLOVE) ×6 IMPLANT
GOWN STRL REUS W/ TWL LRG LVL3 (GOWN DISPOSABLE) ×2 IMPLANT
GOWN STRL REUS W/ TWL XL LVL3 (GOWN DISPOSABLE) ×1 IMPLANT
GOWN STRL REUS W/TWL 2XL LVL3 (GOWN DISPOSABLE) ×3 IMPLANT
GOWN STRL REUS W/TWL LRG LVL3 (GOWN DISPOSABLE) ×4
GOWN STRL REUS W/TWL XL LVL3 (GOWN DISPOSABLE) ×2
KIT BASIN OR (CUSTOM PROCEDURE TRAY) ×3 IMPLANT
KIT ROOM TURNOVER OR (KITS) ×3 IMPLANT
MANIFOLD NEPTUNE II (INSTRUMENTS) ×3 IMPLANT
NEEDLE HYPO 25GX1X1/2 BEV (NEEDLE) ×3 IMPLANT
NEEDLE SPNL 18GX3.5 QUINCKE PK (NEEDLE) ×3 IMPLANT
NS IRRIG 1000ML POUR BTL (IV SOLUTION) ×3 IMPLANT
PACK LAMINECTOMY ORTHO (CUSTOM PROCEDURE TRAY) ×3 IMPLANT
PAD ARMBOARD 7.5X6 YLW CONV (MISCELLANEOUS) ×6 IMPLANT
PATTIES SURGICAL .5 X.5 (GAUZE/BANDAGES/DRESSINGS) IMPLANT
PATTIES SURGICAL .75X.75 (GAUZE/BANDAGES/DRESSINGS) IMPLANT
SUT VIC AB 0 CT1 27 (SUTURE)
SUT VIC AB 0 CT1 27XBRD ANBCTR (SUTURE) IMPLANT
SUT VIC AB 1 CT1 27 (SUTURE) ×2
SUT VIC AB 1 CT1 27XBRD ANBCTR (SUTURE) ×1 IMPLANT
SUT VIC AB 1 CTX 27 (SUTURE) ×3 IMPLANT
SUT VIC AB 2-0 CT1 27 (SUTURE) ×2
SUT VIC AB 2-0 CT1 TAPERPNT 27 (SUTURE) ×1 IMPLANT
SUT VIC AB 3-0 X1 27 (SUTURE) ×6 IMPLANT
TOWEL OR 17X24 6PK STRL BLUE (TOWEL DISPOSABLE) ×3 IMPLANT
TOWEL OR 17X26 10 PK STRL BLUE (TOWEL DISPOSABLE) ×3 IMPLANT
YANKAUER SUCT BULB TIP NO VENT (SUCTIONS) ×3 IMPLANT

## 2016-07-20 NOTE — Anesthesia Preprocedure Evaluation (Signed)
Anesthesia Evaluation  Patient identified by MRN, date of birth, ID band Patient awake    Reviewed: Allergy & Precautions, H&P , NPO status , Patient's Chart, lab work & pertinent test results  Airway Mallampati: II   Neck ROM: full    Dental   Pulmonary Current Smoker,    breath sounds clear to auscultation       Cardiovascular negative cardio ROS   Rhythm:regular Rate:Normal     Neuro/Psych    GI/Hepatic   Endo/Other    Renal/GU      Musculoskeletal  (+) Arthritis ,   Abdominal   Peds  Hematology   Anesthesia Other Findings   Reproductive/Obstetrics                             Anesthesia Physical Anesthesia Plan  ASA: II  Anesthesia Plan: General   Post-op Pain Management:    Induction: Intravenous  Airway Management Planned: Oral ETT  Additional Equipment:   Intra-op Plan:   Post-operative Plan: Extubation in OR  Informed Consent: I have reviewed the patients History and Physical, chart, labs and discussed the procedure including the risks, benefits and alternatives for the proposed anesthesia with the patient or authorized representative who has indicated his/her understanding and acceptance.     Plan Discussed with: CRNA, Anesthesiologist and Surgeon  Anesthesia Plan Comments:         Anesthesia Quick Evaluation  

## 2016-07-20 NOTE — Anesthesia Procedure Notes (Signed)
Procedure Name: Intubation Date/Time: 07/20/2016 1:16 PM Performed by: Marena ChancyBECKNER, Navarre Diana S Pre-anesthesia Checklist: Patient identified, Emergency Drugs available, Suction available and Patient being monitored Patient Re-evaluated:Patient Re-evaluated prior to inductionOxygen Delivery Method: Circle System Utilized Preoxygenation: Pre-oxygenation with 100% oxygen Intubation Type: IV induction Ventilation: Mask ventilation without difficulty Laryngoscope Size: Miller and 3 Grade View: Grade I Tube type: Oral Tube size: 7.5 mm Number of attempts: 1 Airway Equipment and Method: Stylet and Oral airway Placement Confirmation: ETT inserted through vocal cords under direct vision,  positive ETCO2 and breath sounds checked- equal and bilateral Tube secured with: Tape Dental Injury: Teeth and Oropharynx as per pre-operative assessment

## 2016-07-20 NOTE — Op Note (Signed)
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  Surgeon: Annell GreeningMark Christian Borgerding M.D.  Asst. Zonia KiefJames Owens PA-C medically necessary and present for the entire procedure  Anesthesia Gen. orotracheal plus Marcaine local  Complications none  EBL minimal.  Procedure after induction general anesthesia standard prepping draping preoperative Ancef, procedure here squared with towels after the DuraPrep and dried Betadine Steri-Drape applied laminectomy sheet and drapes. Needle localization with spinal needle the L4-5 level confirmed with palpation of bony landmarks and crosstable lateral x-ray confirmed this. Incision was made 2 mm rounded midline subperiosteal dissection with talar tract are placed laterally laminotomy was performed tape and Penfield retractor was placed down with the operative side the inferior aspect of the right L4 lamina and second x-ray was taken which confirmed appropriate level. Bone was marked sterile marker and then lamina was thinned with the 4 mm bur. Bone was removed laterally out to the level of the pedicle thick chunks of the ligamentum were removed. Some epidural veins laterally in the gutter with upper microscope visualization with DuraPrep protected with the Prince SolianGrieco were cauterized with the bipolar carefully. Soft tissue swept off the discs was large bulging disc and caudad migrated fragment which was adherent the undersurface dura. Annulus was incised passes are made to the discs decompressing removing fragments that were loosened. Gradually pieces peeled off the undersurface of the dura. Nerve root was free lateral recess was completely deeper compressed. Epstein Kretzer used to pass and push sharp pieces of disc were some partial consultation not well appreciated on preoperative MRI of the fragment closer to the midline. Fragments were pushed all the  disc and then removed until the dura was completely free dura was around nerve root was free passes were made with the hockey-stick out the foramina anterior the dura there are is compression irrigation epidural space was dry. Tourniquet was removed and the upper microscope that a been draped was moved out of the field. The facets closed with the Vicryl sutures 2 on the subtendinous tissue subarticular skin closure tincture benzoin Lyndall Bellot infiltration Steri-Strips and postop dressing.

## 2016-07-20 NOTE — H&P (Signed)
Patrick Aguirre is an 56 y.o. male.   Chief Complaint: Low back pain and right lower extremity radiculopathy HPI:   Patient with history of right L4-5 HNP and the above complaints presents to the hospital for surgical intervention. Progressively worsening symptoms. Failed conservative treatment.  Past Medical History:  Diagnosis Date  . Arthritis   . Chronic back pain   . Environmental allergies     Past Surgical History:  Procedure Laterality Date  . NO PAST SURGERIES      No family history on file. Social History:  reports that he has been smoking.  He has been smoking about 0.50 packs per day. He has never used smokeless tobacco. He reports that he drinks alcohol. He reports that he does not use drugs.  Allergies:  Allergies  Allergen Reactions  . No Known Allergies     No prescriptions prior to admission.    Results for orders placed or performed during the hospital encounter of 07/19/16 (from the past 48 hour(s))  Surgical pcr screen     Status: None   Collection Time: 07/19/16 11:56 AM  Result Value Ref Range   MRSA, PCR NEGATIVE NEGATIVE   Staphylococcus aureus NEGATIVE NEGATIVE    Comment:        The Xpert SA Assay (FDA approved for NASAL specimens in patients over 65 years of age), is one component of a comprehensive surveillance program.  Test performance has been validated by [facility] for patients greater than or equal to 70 year old. It is not intended to diagnose infection nor to guide or monitor treatment.   CBC     Status: None   Collection Time: 07/19/16 11:57 AM  Result Value Ref Range   WBC 7.8 4.0 - 10.5 K/uL   RBC 4.63 4.22 - 5.81 MIL/uL   Hemoglobin 14.8 13.0 - 17.0 g/dL   HCT 42.7 39.0 - 52.0 %   MCV 92.2 78.0 - 100.0 fL   MCH 32.0 26.0 - 34.0 pg   MCHC 34.7 30.0 - 36.0 g/dL   RDW 12.1 11.5 - 15.5 %   Platelets 261 150 - 400 K/uL  Comprehensive metabolic panel     Status: Abnormal   Collection Time: 07/19/16 11:57 AM  Result Value Ref  Range   Sodium 140 135 - 145 mmol/L   Potassium 4.2 3.5 - 5.1 mmol/L   Chloride 102 101 - 111 mmol/L   CO2 26 22 - 32 mmol/L   Glucose, Bld 97 65 - 99 mg/dL   BUN 8 6 - 20 mg/dL   Creatinine, Ser 1.09 0.61 - 1.24 mg/dL   Calcium 9.8 8.9 - 10.3 mg/dL   Total Protein 9.0 (H) 6.5 - 8.1 g/dL   Albumin 4.7 3.5 - 5.0 g/dL   AST 88 (H) 15 - 41 U/L   ALT 55 17 - 63 U/L   Alkaline Phosphatase 60 38 - 126 U/L   Total Bilirubin 1.3 (H) 0.3 - 1.2 mg/dL   GFR calc non Af Amer >60 >60 mL/min   GFR calc Af Amer >60 >60 mL/min    Comment: (NOTE) The eGFR has been calculated using the CKD EPI equation. This calculation has not been validated in all clinical situations. eGFR's persistently <60 mL/min signify possible Chronic Kidney Disease.    Anion gap 12 5 - 15  Uric acid     Status: Abnormal   Collection Time: 07/19/16 11:57 AM  Result Value Ref Range   Uric Acid, Serum 7.7 (H)  4.4 - 7.6 mg/dL   Dg Chest 2 View  Result Date: 07/19/2016 CLINICAL DATA:  Preop lumbar surgery EXAM: CHEST  2 VIEW COMPARISON:  None. FINDINGS: Mild hyperinflation of the lungs. Heart and mediastinal contours are within normal limits. No focal opacities or effusions. No acute bony abnormality. IMPRESSION: Hyperinflation.  No active cardiopulmonary disease. Electronically Signed   By: Rolm Baptise M.D.   On: 07/19/2016 12:24    Review of Systems  Constitutional: Negative.   HENT: Negative.   Respiratory: Negative.   Cardiovascular: Negative.   Gastrointestinal: Negative.   Genitourinary: Negative.   Musculoskeletal: Positive for back pain.  Skin: Negative.   Neurological: Positive for tingling.  Psychiatric/Behavioral: Negative.     There were no vitals taken for this visit. Physical Exam  Constitutional: He is oriented to person, place, and time. No distress.  HENT:  Head: Normocephalic and atraumatic.  Eyes: EOM are normal. Pupils are equal, round, and reactive to light.  Neck: Normal range of motion.   Respiratory: No respiratory distress.  GI: He exhibits no distension.  Musculoskeletal:  Gait antalgic. Negative logroll bilateral hips. Positive right straight leg raise.  Neurological: He is alert and oriented to person, place, and time.  Skin: Skin is warm and dry.  Psychiatric: He has a normal mood and affect.    Study Result   CLINICAL DATA:  Initial evaluation for persistent back pain. No known injury.  EXAM: MRI LUMBAR SPINE WITHOUT CONTRAST  TECHNIQUE: Multiplanar, multisequence MR imaging of the lumbar spine was performed. No intravenous contrast was administered.  COMPARISON:  Prior radiograph from 04/27/2016.  FINDINGS: Segmentation: Normal segmentation. Lowest well-formed disc is labeled the L5-S1 level.  Alignment: Vertebral bodies normally aligned with preservation of the normal lumbar lordosis. No listhesis.  Vertebrae: Vertebral body heights are maintained. No evidence for acute or chronic fracture. Signal intensity within the vertebral body bone marrow is heterogeneous. Probable atypical hemangioma noted within the L4 vertebral body. No other discrete osseous lesions identified.  Conus medullaris: Extends to the L1 level and appears normal.  Paraspinal and other soft tissues: Paraspinous soft tissues within normal limits. Visualized visceral structures are unremarkable.  Disc levels:  L1-2:  Negative.  L2-3: Diffuse degenerative disc bulge with disc desiccation and intervertebral disc space narrowing. Disc bulging slightly eccentric to the left without focal disc protrusion. No significant canal stenosis. Mild left foraminal narrowing. Right neural foramen remains widely patent.  L3-4: Diffuse degenerative disc bulge with disc desiccation. Disc bulging slightly eccentric to the right without focal disc protrusion. Mild ligamentum flavum hypertrophy. No significant canal stenosis. Moderate bilateral foraminal stenosis.  L4-5:  Diffuse degenerative disc bulge with disc desiccation. There is a superimposed right subarticular disc extrusion extending into the right lateral recess (series 3, image 8). This could potentially effect either the right L5 or S1 nerve roots. Inferior migration measures approximately 8 mm inferior to the parent L4-5 disc space. No significant canal stenosis. Superimposed mild facet and ligamentum flavum hypertrophy. Moderate bilateral foraminal stenosis.  L5-S1: No disc bulge or disc protrusion. Mild bilateral facet arthrosis. No stenosis.  IMPRESSION: 1. Right subarticular disc extrusion with inferior migration, extending into the right lateral recess. This could potentially effect either the right L5 or S1 nerve roots. 2. Degenerative disc bulge at L3-4 and L4-5 with resultant moderate bilateral foraminal stenosis 3. Degenerative disc bulge at L2-3 with resultant mild left foraminal narrowing.   Electronically Signed   By: Jeannine Boga M.D.   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 Surgical procedure along with possible rehabilitation/recovery time discussed. All questions answered.  Benjiman Core, PA-C 07/20/2016, 9:19 AM

## 2016-07-20 NOTE — Transfer of Care (Signed)
Immediate Anesthesia Transfer of Care Note  Patient: Akiem Gemmill  Procedure(s) Performed: Procedure(s): Right L4-5 Microdiscectomy (N/A)  Patient Location: PACU  Anesthesia Type:General  Level of Consciousness: awake, alert  and oriented  Airway & Oxygen Therapy: Patient Spontanous Breathing and Patient connected to nasal cannula oxygen  Post-op Assessment: Report given to RN and Post -op Vital signs reviewed and stable  Post vital signs: Reviewed and stable  Last Vitals:  Vitals:   07/20/16 1022  BP: (!) 174/83  Pulse: 65  Resp: 20  Temp: 36.8 C    Last Pain:  Vitals:   07/20/16 1022  TempSrc: Oral      Patients Stated Pain Goal: 4 (07/20/16 1037)  Complications: No apparent anesthesia complications

## 2016-07-20 NOTE — Interval H&P Note (Signed)
History and Physical Interval Note:  07/20/2016 1:01 PM  Patrick MattocksRicky Dentremont  has presented today for surgery, with the diagnosis of Right L4-5 Herniated Nucleus Pulposus  The various methods of treatment have been discussed with the patient and family. After consideration of risks, benefits and other options for treatment, the patient has consented to  Procedure(s): Right L4-5 Microdiscectomy (N/A) as a surgical intervention .  The patient's history has been reviewed, patient examined, no change in status, stable for surgery.  I have reviewed the patient's chart and labs.  Questions were answered to the patient's satisfaction.     Eldred MangesMark C Elycia Woodside

## 2016-07-21 ENCOUNTER — Encounter (HOSPITAL_COMMUNITY): Payer: Self-pay | Admitting: Orthopaedic Surgery

## 2016-07-21 MED ORDER — OXYCODONE-ACETAMINOPHEN 5-325 MG PO TABS: 1.0000 | ORAL_TABLET | Freq: Four times a day (QID) | ORAL | 0 refills | Status: DC | PRN

## 2016-07-21 MED ORDER — METHOCARBAMOL 500 MG PO TABS: 500.0000 mg | ORAL_TABLET | Freq: Four times a day (QID) | ORAL | 0 refills | Status: DC | PRN

## 2016-07-21 NOTE — Progress Notes (Signed)
Discharge instructions given. Pt verbalized understanding and all questions were answered.  

## 2016-07-21 NOTE — Anesthesia Postprocedure Evaluation (Signed)
Anesthesia Post Note  Patient: Patrick Aguirre  Procedure(s) Performed: Procedure(s) (LRB): Right L4-5 Microdiscectomy (N/A)  Patient location during evaluation: PACU Anesthesia Type: General Level of consciousness: awake and alert and patient cooperative Pain management: pain level controlled Vital Signs Assessment: post-procedure vital signs reviewed and stable Respiratory status: spontaneous breathing and respiratory function stable Cardiovascular status: stable Anesthetic complications: no       Last Vitals:  Vitals:   07/20/16 1941 07/21/16 0450  BP: 139/60 (!) 148/66  Pulse: 62 66  Resp: 12 12  Temp: 36.9 C 36.8 C    Last Pain:  Vitals:   07/21/16 0450  TempSrc: Oral  PainSc:                  [name] S

## 2016-07-21 NOTE — Progress Notes (Signed)
   Subjective: 1 Day Post-Op Procedure(s) (LRB): Right L4-5 Microdiscectomy (N/A) Patient reports pain as mild.  Leg pain gone, some incisional pain.   Objective: Vital signs in last 24 hours: Temp:  [98 F (36.7 C)-98.4 F (36.9 C)] 98.3 F (36.8 C) (03/01 0450) Pulse Rate:  [54-68] 66 (03/01 0450) Resp:  [11-20] 12 (03/01 0450) BP: (139-174)/(60-86) 148/66 (03/01 0450) SpO2:  [94 %-100 %] 94 % (03/01 0450) Weight:  [210 lb 15.7 oz (95.7 kg)-211 lb (95.7 kg)] 210 lb 15.7 oz (95.7 kg) (02/28 2000)  Intake/Output from previous day: 02/28 0701 - 03/01 0700 In: 1840 [P.O.:240; I.V.:1600] Out: 1000 [Urine:950; Blood:50] Intake/Output this shift: No intake/output data recorded.   Recent Labs  07/19/16 1157  HGB 14.8    Recent Labs  07/19/16 1157  WBC 7.8  RBC 4.63  HCT 42.7  PLT 261    Recent Labs  07/19/16 1157  NA 140  K 4.2  CL 102  CO2 26  BUN 8  CREATININE 1.09  GLUCOSE 97  CALCIUM 9.8   No results for input(s): LABPT, INR in the last 72 hours.  Neurologically intact Dg Lumbar Spine 2-3 Views  Result Date: 07/20/2016 CLINICAL DATA:  L4-5 microdiscectomy EXAM: LUMBAR SPINE - 2 VIEW COMPARISON:  06/22/2016 FINDINGS: Two lateral views of the lumbar spine were obtained intraoperatively. The initial image demonstrates a needle within the posterior soft tissues at the L4-5 level. Subsequently a this set can't lateral film shows surgical retractors at a similar level. The numbering nomenclature reflects that of the recent MRI. IMPRESSION: Intraoperative localization at L4-5 Electronically Signed   By: Alcide CleverMark  Lukens M.D.   On: 07/20/2016 14:00    Assessment/Plan: 1 Day Post-Op Procedure(s) (LRB): Right L4-5 Microdiscectomy (N/A) Plan :  Discharge home with folding walker. Office one week  Eldred MangesMark C Zan Orlick 07/21/2016, 7:42 AM

## 2016-07-21 NOTE — Discharge Instructions (Signed)
Ok to remove dressing , shower, then cover incision with large band aid so waist band does not rub on the incision. Walk daily , slowly progress up to 2 miles per day. Avoid sitting except toilet and to sit up for meals. OK for kicked back in a recliner. OK for laying down, side, back or stomach. See Dr. Ophelia CharterYates PA Zonia KiefJames Owens PA-C in about one week. Call for appt [phone]

## 2016-07-27 ENCOUNTER — Encounter (INDEPENDENT_AMBULATORY_CARE_PROVIDER_SITE_OTHER): Payer: Self-pay | Admitting: Surgery

## 2016-07-27 ENCOUNTER — Ambulatory Visit (INDEPENDENT_AMBULATORY_CARE_PROVIDER_SITE_OTHER): Payer: Self-pay | Admitting: Surgery

## 2016-07-27 VITALS — BP 150/78 | HR 78 | Ht 71.0 in | Wt 210.0 lb

## 2016-07-27 DIAGNOSIS — Z9889 Other specified postprocedural states: Secondary | ICD-10-CM

## 2016-07-27 MED ORDER — HYDROCODONE-ACETAMINOPHEN 10-325 MG PO TABS: 1.0000 | ORAL_TABLET | Freq: Four times a day (QID) | ORAL | 0 refills | Status: DC | PRN

## 2016-07-27 NOTE — Progress Notes (Signed)
   Post-Op Visit Note   Patient: Patrick MattocksRicky Arpin           Date of Birth: July 01, 1960           MRN: [id number] Visit Date: 07/27/2016 PCP: No PCP Per Patient   Assessment & Plan:  Chief Complaint:  Mr. Blanchie ServeFlacks is here for 1 week post op from Right L4-5 Microdiscectomy done 07/20/2016.  He states that his Right leg is still bothering him but it is a different type of pain.  The Left leg is not bothering anymore.   Visit Diagnoses:  1. S/P lumbar microdiscectomy     Plan: Patient will avoid bending twisting lifting. Gradually increase his walking distances. [facility] about 4 weeks for recheck.  Follow-Up Instructions: Return in about 4 weeks (around 08/24/2016) for [facility].   Orders:  No orders of the defined types were placed in this encounter.  Meds ordered this encounter  Medications  . HYDROcodone-acetaminophen (NORCO) 10-325 MG tablet    Sig: Take 1 tablet by mouth every 6 (six) hours as needed.    Dispense:  50 tablet    Refill:  0    Imaging: No results found.  PMFS History: Patient Active Problem List   Diagnosis Date Noted  . HNP (herniated nucleus pulposus), lumbar 07/20/2016   Past Medical History:  Diagnosis Date  . Arthritis   . Chronic back pain   . Environmental allergies     No family history on file.  Past Surgical History:  Procedure Laterality Date  . LUMBAR LAMINECTOMY/DECOMPRESSION MICRODISCECTOMY N/A 07/20/2016   Procedure: Right L4-5 Microdiscectomy;  Surgeon: Eldred MangesMark C Yates, MD;  [facility];  Service: Orthopedics;  Laterality: N/A;  . NO PAST SURGERIES     Social History   Occupational History  . Not on file.   Social History Main Topics  . Smoking status: Current Every Day Smoker    Packs/day: 0.50  . Smokeless tobacco: Never Used  . Alcohol use Yes     Comment: 2 beers daily  . Drug use: No  . Sexual activity: Not on file   Exam: Surgical incision is well-healed. No drainage or  signs of infection. Neurovascularly intact.

## 2016-07-31 NOTE — Discharge Summary (Signed)
Patient ID: Patrick Aguirre MRN: [id number] DOB/AGE: 58-24-1962 58 y.o.  Admit date: 07/20/2016 Discharge date: 07/31/2016  Admission Diagnoses:  Active Problems:   HNP (herniated nucleus pulposus), lumbar   Discharge Diagnoses:  Active Problems:   HNP (herniated nucleus pulposus), lumbar  status post Procedure(s): Right L4-5 Microdiscectomy  Past Medical History:  Diagnosis Date  . Arthritis   . Chronic back pain   . Environmental allergies     Surgeries: Procedure(s): Right L4-5 Microdiscectomy on 07/20/2016   Consultants:   Discharged Condition: Improved  Hospital Course: Patrick Aguirre is an 56 y.o. male who was admitted 07/20/2016 for operative treatment of lumbar HNP. Patient failed conservative treatments (please see the history and physical for the specifics) and had severe unremitting pain that affects sleep, daily activities and work/hobbies. After pre-op clearance, the patient was taken to the operating room on 07/20/2016 and underwent  Procedure(s): Right L4-5 Microdiscectomy.    Patient was given perioperative antibiotics:  Anti-infectives    Start     Dose/Rate Route Frequency Ordered Stop   07/20/16 1032  ceFAZolin (ANCEF) 2-4 GM/100ML-% IVPB    Comments:  [place]   : cabinet override      07/20/16 1032 07/20/16 1318   07/20/16 1031  ceFAZolin (ANCEF) IVPB 2g/100 mL premix     2 g 200 mL/hr over 30 Minutes Intravenous On call to O.R. 07/20/16 1031 07/20/16 1318       Patient was given sequential compression devices and early ambulation to prevent DVT.   Patient benefited maximally from hospital stay and there were no complications. At the time of discharge, the patient was urinating/moving their bowels without difficulty, tolerating a regular diet, pain is controlled with oral pain medications and they have been cleared by PT/OT.   Recent vital signs: No data found.    Recent laboratory studies: No results for input(s): WBC, HGB, HCT, PLT, NA,  K, CL, CO2, BUN, CREATININE, GLUCOSE, INR, CALCIUM in the last 72 hours.  Invalid input(s): PT, 2   Discharge Medications:   Allergies as of 07/21/2016      Reactions   No Known Allergies       Medication List    TAKE these medications   methocarbamol 500 MG tablet Commonly known as:  ROBAXIN Take 1 tablet (500 mg total) by mouth every 6 (six) hours as needed for muscle spasms.   oxyCODONE-acetaminophen 5-325 MG tablet Commonly known as:  ROXICET Take 1-2 tablets by mouth every 6 (six) hours as needed for severe pain.       Diagnostic Studies: Dg Chest 2 View  Result Date: 07/19/2016 CLINICAL DATA:  Preop lumbar surgery EXAM: CHEST  2 VIEW COMPARISON:  None. FINDINGS: Mild hyperinflation of the lungs. Heart and mediastinal contours are within normal limits. No focal opacities or effusions. No acute bony abnormality. IMPRESSION: Hyperinflation.  No active cardiopulmonary disease. Electronically Signed   By: Charlett Nose M.D.   On: 07/19/2016 12:24   Dg Lumbar Spine 2-3 Views  Result Date: 07/20/2016 CLINICAL DATA:  L4-5 microdiscectomy EXAM: LUMBAR SPINE - 2 VIEW COMPARISON:  06/22/2016 FINDINGS: Two lateral views of the lumbar spine were obtained intraoperatively. The initial image demonstrates a needle within the posterior soft tissues at the L4-5 level. Subsequently a this set can't lateral film shows surgical retractors at a similar level. The numbering nomenclature reflects that of the recent MRI. IMPRESSION: Intraoperative localization at L4-5 Electronically Signed   By: Alcide Clever M.D.   On:  07/20/2016 14:00      Follow-up Information    Zonia KiefJames Jaxen Samples, PA-C Follow up in 1 week(s).   Specialties:  Physician Assistant, Orthopedic Surgery Contact information: [address] [place] [address] [phone]           Discharge Plan:  discharge to home  Disposition:     Signed: Zonia KiefJames Sovereign Ramiro  07/31/2016, 11:14 PM

## 2016-08-24 ENCOUNTER — Ambulatory Visit (INDEPENDENT_AMBULATORY_CARE_PROVIDER_SITE_OTHER): Payer: Self-pay | Admitting: Orthopaedic Surgery

## 2016-08-24 ENCOUNTER — Encounter (INDEPENDENT_AMBULATORY_CARE_PROVIDER_SITE_OTHER): Payer: Self-pay | Admitting: Orthopaedic Surgery

## 2016-08-24 VITALS — BP 160/84 | HR 71 | Ht 71.0 in | Wt 210.0 lb

## 2016-08-24 DIAGNOSIS — M545 Low back pain, unspecified: Secondary | ICD-10-CM

## 2016-08-24 NOTE — Progress Notes (Signed)
   Post-Op Visit Note   Patient: Patrick Aguirre           Date of Birth: [birth year]           MRN: [id number] Visit Date: 08/24/2016 PCP: No PCP Per Patient   Assessment & Plan: Postop right L4-5 microdiscectomy. He is walking better activity is improved.  Chief Complaint:  Chief Complaint  Patient presents with  . Lower Back - Routine Post Op   Visit Diagnoses: No diagnosis found.  Plan: Patient is doing well post right L4-5 microdiscectomy. We'll follow him up on an as-needed basis incision is well-healed. Return as needed.  Follow-Up Instructions: No Follow-up on file.   Orders:  No orders of the defined types were placed in this encounter.  No orders of the defined types were placed in this encounter.  HPI Patient returns for four week follow up. He is status post right L4-5 microdiscectomy on 07/20/2016. He is 5 weeks post op. He states that he is doing much better. He does not have leg pain unless he walks on it a lot. He is taking tylenol as needed.   Imaging: No results found.  PMFS History: Patient Active Problem List   Diagnosis Date Noted  . HNP (herniated nucleus pulposus), lumbar 07/20/2016   Past Medical History:  Diagnosis Date  . Arthritis   . Chronic back pain   . Environmental allergies     No family history on file.  Past Surgical History:  Procedure Laterality Date  . LUMBAR LAMINECTOMY/DECOMPRESSION MICRODISCECTOMY N/A 07/20/2016   Procedure: Right L4-5 Microdiscectomy;  Surgeon: Eldred Manges, MD;  Location: [facility] OR;  Service: Orthopedics;  Laterality: N/A;  . NO PAST SURGERIES     Social History   Occupational History  . Not on file.   Social History Main Topics  . Smoking status: Current Every Day Smoker    Packs/day: 0.50  . Smokeless tobacco: Never Used  . Alcohol use Yes     Comment: 2 beers daily  . Drug use: No  . Sexual activity: Not on file

## 2016-10-25 ENCOUNTER — Ambulatory Visit (INDEPENDENT_AMBULATORY_CARE_PROVIDER_SITE_OTHER): Payer: Self-pay | Admitting: Orthopaedic Surgery

## 2016-12-13 ENCOUNTER — Encounter (HOSPITAL_COMMUNITY): Payer: Self-pay

## 2016-12-13 ENCOUNTER — Telehealth (INDEPENDENT_AMBULATORY_CARE_PROVIDER_SITE_OTHER): Payer: Self-pay

## 2016-12-13 ENCOUNTER — Emergency Department (HOSPITAL_COMMUNITY)
Admission: EM | Admit: 2016-12-13 | Discharge: 2016-12-13 | Disposition: A | Discharge status: Home / Self Care | Payer: Self-pay | Attending: Emergency Medicine | Admitting: Emergency Medicine

## 2016-12-13 DIAGNOSIS — F17210 Nicotine dependence, cigarettes, uncomplicated: Secondary | ICD-10-CM | POA: Insufficient documentation

## 2016-12-13 DIAGNOSIS — M79604 Pain in right leg: Secondary | ICD-10-CM

## 2016-12-13 DIAGNOSIS — M79661 Pain in right lower leg: Secondary | ICD-10-CM | POA: Insufficient documentation

## 2016-12-13 DIAGNOSIS — R202 Paresthesia of skin: Secondary | ICD-10-CM | POA: Insufficient documentation

## 2016-12-13 MED ORDER — PREDNISONE 20 MG PO TABS: 20.0000 mg | ORAL_TABLET | Freq: Two times a day (BID) | ORAL | 0 refills | Status: DC

## 2016-12-13 NOTE — Telephone Encounter (Signed)
Patient call concerning an office visit note.

## 2016-12-13 NOTE — ED Provider Notes (Signed)
[facility]-EMER[facility]ENCY DEPT Provider Note   CSN: [id number] Arrival date & time: 12/13/16  0802     History   Chief Complaint Chief Complaint  Patient presents with  . Leg Pain    HPI Patrick Aguirre is a 56 y.o. male.  He presents for evaluation of pain and numbness in right leg following lawnmowing about a week ago.  He was using a push more.  This is the first time he has done that since his surgery in February 2018, right lumbar microdiscectomy.  The discomfort has improved but persists.  He denies bowel or bladder incontinence.  He denies fever, chills, nausea or vomiting.  He is taking his usual medications.  His doctor advised him to walk 5 miles a day following surgery, and he is up to about 2-1/2 miles.  Last saw his orthopedist, 3 months ago.  There are no other known modifying factors.   HPI  Past Medical History:  Diagnosis Date  . Arthritis   . Chronic back pain   . Environmental allergies     There are no active problems to display for this patient.   Past Surgical History:  Procedure Laterality Date  . LUMBAR LAMINECTOMY/DECOMPRESSION MICRODISCECTOMY N/A 07/20/2016   Procedure: Right L4-5 Microdiscectomy;  Surgeon: Eldred Manges, MD;  Location: [facility];  Service: Orthopedics;  Laterality: N/A;  . NO PAST SURGERIES         Home Medications    Prior to Admission medications   Medication Sig Start Date End Date Taking? Authorizing Provider  acetaminophen (TYLENOL) 500 MG tablet Take 500 mg by mouth every 6 (six) hours as needed.    [provider]  HYDROcodone-acetaminophen (NORCO) 10-325 MG tablet Take 1 tablet by mouth every 6 (six) hours as needed. Patient not taking: Reported on 08/24/2016 07/27/16   Naida Sleight, PA-C  methocarbamol (ROBAXIN) 500 MG tablet Take 1 tablet (500 mg total) by mouth every 6 (six) hours as needed for muscle spasms. Patient not taking: Reported on 08/24/2016 07/21/16   Eldred Manges, MD  oxyCODONE-acetaminophen (ROXICET) 5-325 MG  tablet Take 1-2 tablets by mouth every 6 (six) hours as needed for severe pain. Patient not taking: Reported on 08/24/2016 07/21/16   Eldred Manges, MD  predniSONE (DELTASONE) 20 MG tablet Take 1 tablet (20 mg total) by mouth 2 (two) times daily. 12/13/16   Mancel Bale, MD    Family History No family history on file.  Social History Social History  Substance Use Topics  . Smoking status: Current Every Day Smoker    Packs/day: 0.50  . Smokeless tobacco: Never Used  . Alcohol use Yes     Comment: 2 beers daily     Allergies   No known allergies   Review of Systems Review of Systems  All other systems reviewed and are negative.    Physical Exam Updated Vital Signs BP (!) 174/85 (BP Location: Left Arm)   Pulse 64   Temp 98.4 F (36.9 C) (Oral)   Resp 18   Ht 5\' 11"  (1.803 m)   Wt 94.3 kg (208 lb)   SpO2 100%   BMI 29.01 kg/m   Physical Exam  Constitutional: He is oriented to person, place, and time. He appears well-developed and well-nourished. No distress.  HENT:  Head: Normocephalic and atraumatic.  Right Ear: External ear normal.  Left Ear: External ear normal.  Eyes: Pupils are equal, round, and reactive to light. Conjunctivae and EOM are normal.  Neck: Normal  range of motion and phonation normal. Neck supple.  Cardiovascular: Normal rate.   Pulmonary/Chest: Effort normal. He exhibits no bony tenderness.  Musculoskeletal: Normal range of motion.  Negative straight leg raising.  Neurological: He is alert and oriented to person, place, and time. No cranial nerve deficit or sensory deficit. He exhibits normal muscle tone. Coordination normal.  Sensation intact right leg.  Skin: Skin is warm, dry and intact.  Psychiatric: He has a normal mood and affect. His behavior is normal. Judgment and thought content normal.  Nursing note and vitals reviewed.    ED Treatments / Results  Labs (all labs ordered are listed, but only abnormal results are displayed) Labs  Reviewed - No data to display  EKG  EKG Interpretation None       Radiology No results found.  Procedures Procedures (including critical care time)  Medications Ordered in ED Medications - No data to display   Initial Impression / Assessment and Plan / ED Course  I have reviewed the triage vital signs and the nursing notes.  Pertinent labs & imaging results that were available during my care of the patient were reviewed by me and considered in my medical decision making (see chart for details).      Patient Vitals for the past 24 hrs:  BP Temp Temp src Pulse Resp SpO2 Height Weight  12/13/16 0809 - - - - - - 5\' 11"  (1.803 m) 94.3 kg (208 lb)  12/13/16 0808 (!) 174/85 98.4 F (36.9 C) Oral 64 18 100 % - -    9:04 AM Reevaluation with update and discussion. After initial assessment and treatment, an updated evaluation reveals no change in clinical status.  Findings discussed with patient, and all questions were answered. Flordia Kassem L    Final Clinical Impressions(s) / ED Diagnoses   Final diagnoses:  Pain of right lower extremity  Paresthesia   Nonspecific right leg discomfort, with history of degenerative joint disease, and herniated disc, status post surgery.  No evidence for lumbar myelopathy, fracture or discitis.  Nursing Notes Reviewed/ Care Coordinated Applicable Imaging Reviewed Interpretation of Laboratory Data incorporated into ED treatment  The patient appears reasonably screened and/or stabilized for discharge and I doubt any other medical condition or other [facility] requiring further screening, evaluation, or treatment in the ED at this time prior to discharge.  Plan: Home Medications-continue usual medications; Home Treatments-rest, heat; return here if the recommended treatment, does not improve the symptoms; Recommended follow up-PCP and orthopedist, as needed    New Prescriptions New Prescriptions   PREDNISONE (DELTASONE) 20 MG TABLET    Take 1  tablet (20 mg total) by mouth 2 (two) times daily.     Mancel BaleWentz, Talah Cookston, MD 12/13/16 [phone]

## 2016-12-13 NOTE — ED Triage Notes (Signed)
Pt reports was push mowing last Monday and started having pain in r leg and numbness.  Denies numbness in any other extremity.  Pt walks with cane.

## 2016-12-13 NOTE — Discharge Instructions (Signed)
Use heat on the area of discomfort 3 or 4 times a day.  It is not clear why you are having numbness and pain in your right leg.  It may be related to overexertion, last week.  Avoid prolonged walking when you are having discomfort.  You will need to see orthopedic doctor if you are not improving within a week.

## 2017-01-06 ENCOUNTER — Encounter (INDEPENDENT_AMBULATORY_CARE_PROVIDER_SITE_OTHER): Payer: Self-pay | Admitting: Surgery

## 2017-01-06 ENCOUNTER — Ambulatory Visit (INDEPENDENT_AMBULATORY_CARE_PROVIDER_SITE_OTHER): Payer: Self-pay

## 2017-01-06 ENCOUNTER — Ambulatory Visit (INDEPENDENT_AMBULATORY_CARE_PROVIDER_SITE_OTHER): Payer: Self-pay | Admitting: Surgery

## 2017-01-06 DIAGNOSIS — M79604 Pain in right leg: Secondary | ICD-10-CM

## 2017-01-06 DIAGNOSIS — M25551 Pain in right hip: Secondary | ICD-10-CM

## 2017-01-06 DIAGNOSIS — M5416 Radiculopathy, lumbar region: Secondary | ICD-10-CM

## 2017-01-06 NOTE — Progress Notes (Signed)
Office Visit Note   Patient: Patrick Aguirre           Date of Birth: 19-Jan-1961           MRN: [id number] Visit Date: 01/06/2017              Requested by: No referring provider defined for this encounter. PCP: Patient, No Pcp Per   Assessment & Plan: Visit Diagnoses:  1. Pain in right hip   2. Right leg pain   3. Radiculopathy, lumbar region     Plan: With patient's worsening symptoms and failed conservative treatment we'll schedule lumbar spine MRI with contrast to rule out recurrent L4-5 HNP. Follow-up with Dr. Ophelia Charter after study to discuss results and further treatment options. Patient will avoid bending twisting lifting activities.  Follow-Up Instructions: Return in about 2 weeks (around 01/20/2017) for yates to review mri lumbar.   Orders:  Orders Placed This Encounter  Procedures  . XR HIP UNILAT W OR W/O PELVIS 1V RIGHT  . XR Lumbar Spine 2-3 Views  . MR LUMBAR SPINE W CONTRAST   No orders of the defined types were placed in this encounter.     Procedures: No procedures performed   Clinical Data: No additional findings.   Subjective: No chief complaint on file.   HPI 56 year old white male comes in to the office with complaints of worsening low back pain and right leg pain, numbness and tingling. Status post right L4-5 microdiscectomy by Dr. Ophelia Charter February 2018. Patient states that he flared up his back about 3 months ago when he was using a push lock more. The last 4-6 weeks he's been having increased worsening pain in the right low back that radiates into the right thigh and calf. This has been fairly constant but increased with activity. No points of bowel or bladder incontinence or left lower extremity radicular symptoms. Patient also points to having pain in the right groin with weightbearing. He was seen at the any pain ER 12/13/2016 and prescribed prednisone which he discontinued due to GI upset. Review of Systems No current complaints of cardiac pulmonary  GI GU issues.  Objective: Vital Signs: There were no vitals taken for this visit.  Physical Exam  Constitutional: He is oriented to person, place, and time. He appears well-developed. No distress.  HENT:  Head: Normocephalic and atraumatic.  Eyes: Pupils are equal, round, and reactive to light. EOM are normal.  Neck: Normal range of motion.  Pulmonary/Chest: No respiratory distress.  Abdominal: He exhibits no distension.  Musculoskeletal:  Gait is antalgic. Positive right-sided lumbar paraspinal tenderness/spasms. Positive right sciatic notch tenderness. Negative on the left side. With about 20-30 right hip internal rotation this causes some groin pain. Pain with right straight leg raise. Neurovascularly intact. No focal motor deficits. Bilateral calves are nontender.  Neurological: He is alert and oriented to person, place, and time.  Skin: Skin is warm and dry.  Psychiatric: He has a normal mood and affect.    Ortho Exam  Specialty Comments:  No specialty comments available.  Imaging: No results found.   PMFS History: There are no active problems to display for this patient.  Past Medical History:  Diagnosis Date  . Arthritis   . Chronic back pain   . Environmental allergies     No family history on file.  Past Surgical History:  Procedure Laterality Date  . LUMBAR LAMINECTOMY/DECOMPRESSION MICRODISCECTOMY N/A 07/20/2016   Procedure: Right L4-5 Microdiscectomy;  Surgeon: Eldred Manges, MD;  Location: [facility] OR;  Service: Orthopedics;  Laterality: N/A;  . NO PAST SURGERIES     Social History   Occupational History  . Not on file.   Social History Main Topics  . Smoking status: Current Every Day Smoker    Packs/day: 0.50  . Smokeless tobacco: Never Used  . Alcohol use Yes     Comment: 2 beers daily  . Drug use: No  . Sexual activity: Not on file

## 2017-01-22 ENCOUNTER — Ambulatory Visit
Admission: RE | Admit: 2017-01-22 | Discharge: 2017-01-22 | Disposition: A | Discharge status: Home / Self Care | Payer: Self-pay | Source: Ambulatory Visit | Attending: Surgery | Admitting: Surgery

## 2017-01-22 DIAGNOSIS — M5416 Radiculopathy, lumbar region: Secondary | ICD-10-CM

## 2017-01-22 MED ORDER — GADOBENATE DIMEGLUMINE 529 MG/ML IV SOLN
19.0000 mL | Freq: Once | INTRAVENOUS | Status: AC | PRN
  Administered 2017-01-22: 19 mL via INTRAVENOUS

## 2017-02-08 ENCOUNTER — Ambulatory Visit (INDEPENDENT_AMBULATORY_CARE_PROVIDER_SITE_OTHER): Payer: Medicaid Other | Admitting: Orthopaedic Surgery

## 2017-02-08 ENCOUNTER — Encounter (INDEPENDENT_AMBULATORY_CARE_PROVIDER_SITE_OTHER): Payer: Self-pay | Admitting: Orthopaedic Surgery

## 2017-02-08 VITALS — BP 154/87 | HR 84 | Ht 71.0 in | Wt 205.0 lb

## 2017-02-08 DIAGNOSIS — M79604 Pain in right leg: Secondary | ICD-10-CM

## 2017-02-08 DIAGNOSIS — I739 Peripheral vascular disease, unspecified: Secondary | ICD-10-CM

## 2017-02-08 NOTE — Progress Notes (Signed)
Office Visit Note   Patient: Patrick Aguirre           Date of Birth: 1960-08-31           MRN: [id number] Visit Date: 02/08/2017              Requested by: No referring provider defined for this encounter. PCP: Patient, No Pcp Per   Assessment & Plan: Visit Diagnoses:  1. Peripheral artery disease (HCC)   2. Pain in right leg     Plan: We'll obtain ABIs  lower extremity for evaluation of his newfound deep degrees pulses in his right lower extremity. We discussed smoking cessation. Follow-up after ABIs. We discussed that if he has decreased arterial perfusion he may require vascular surgery referral.  Follow-Up Instructions: No Follow-up on file.   Orders:  No orders of the defined types were placed in this encounter.  No orders of the defined types were placed in this encounter.     Procedures: No procedures performed   Clinical Data: No additional findings.   Subjective: Chief Complaint  Patient presents with  . Lower Back - Pain, Follow-up    HPI 56 year old male returns with ongoing pain right leg when he stands or walks. He gets relief with sitting. He does not notice any difference with her grocery cart. Previous microdiscectomy right L4-5 with recent MRI showing good decompression of lateral recess stenosis from previous disc herniation. X-rays 12/27/2016 showed normal hip joint. Has increased leg pain trying to walk up stairs. Pain occurs in his right leg and into his right calf. It is not associated with numbness in his foot.  Review of Systems positive  for long-term smoking history. L4-5 right microdiscectomy February 2018.   Objective: Vital Signs: BP (!) 154/87   Pulse 84   Ht  (1.803 m)   Wt 205 lb (93 kg)   BMI 28.59 kg/m   Physical Exam  Constitutional: He is oriented to person, place, and time. He appears well-developed and well-nourished.  HENT:  Head: Normocephalic and atraumatic.  Eyes: Pupils are equal, round, and reactive to light.  EOM are normal.  Neck: No tracheal deviation present. No thyromegaly present.  Cardiovascular: Normal rate.   Pulmonary/Chest: Effort normal. He has no wheezes.  Abdominal: Soft. Bowel sounds are normal.  Musculoskeletal:  Lumbar incision is well healed no tenderness at the lumbar incisioncellulitis no drainage. Surgical incision is well-healed. No sciatic notch tenderness.  Neurological: He is alert and oriented to person, place, and time.  Skin: Skin is warm and dry. Capillary refill takes less than 2 seconds.  Psychiatric: He has a normal mood and affect. His behavior is normal. Judgment and thought content normal.    Ortho Exam patient has good pedal pulses on the left. Pedal pulses on the right side showed trace posterior tibial absent dorsalis pedis. Foot is warm. He has palpable popliteal pulse right and left.  Specialty Comments:  No specialty comments available.  Imaging: Study Result   CLINICAL DATA:  Right thigh and low back pain with numbness in both legs. L4-5 microdiscectomy in February 2018.  EXAM: MRI LUMBAR SPINE WITHOUT AND WITH CONTRAST  TECHNIQUE: Multiplanar and multiecho pulse sequences of the lumbar spine were obtained without and with intravenous contrast.  CONTRAST:  19mL MULTIHANCE GADOBENATE DIMEGLUMINE 529 MG/ML IV SOLN  COMPARISON:  Multiple exams, including 06/22/2016 MRI  FINDINGS: Segmentation: The lowest lumbar type non-rib-bearing vertebra is labeled as L5.  Alignment:  3 mm degenerative retrolisthesis  at L2-3.  Vertebrae: Small hemangiomas eccentric to the right in the L4 and L5 vertebra. Marrow heterogeneity is present. Although this can be caused by marrow infiltrative processes, the most common causes include anemia, smoking, obesity, or advancing age. Type 2 degenerative endplate findings along the superior endplates of L3 and L4.  Conus medullaris: Extends to the L1-2 Level and appears normal. No clumping or abnormal  enhancement of nerve roots to suggest arachnoiditis.  Paraspinal and other soft tissues:  A fluid signal intensity lesion of the left kidney is partially characterized on today's exam. This is statistically likely to be a cyst but not technically specific.  Disc levels:  T12-L1: Unremarkable.  L1- 2:  No impingement.  Mild disc bulge.  L2-3: Mild central narrowing of the thecal sac and mild left foraminal stenosis due to disc bulge and left lateral recess and foraminal disc protrusion.  L3-4: Mild right and borderline left foraminal stenosis due to disc bulge and intervertebral spurring. No change from prior.  L4-5: Mild right and borderline left foraminal stenosis the and mild central narrowing of the thecal sac due to disc bulge, intervertebral spurring, and facet arthropathy. Right laminectomy with relief of the prior right subarticular lateral recess stenosis at and resection of the prior disc extrusion/ disc fragment. The right L5 nerve roots course through enhancing tissue in the right lateral recess compatible with epidural fibrosis.  L5-S1:  No impingement.  Mild facet arthropathy.  IMPRESSION: 1. Interval relief of the right subarticular lateral recess stenosis at the L4-5 level due to right laminectomy and microdiscectomy. The right L5 nerve courses through epidural fibrosis in the right lateral recess at this level. 2. Mild impingement at L2-3, L3-4, and L4-5 is otherwise similar to the prior exam as detailed above. 3. Marrow heterogeneity is present. Although this can be caused by marrow infiltrative processes, the most common causes include anemia, smoking, obesity, or advancing age.   Electronically Signed   By: Gaylyn Rong M.D.   On: 01/22/2017 13:45       PMFS History: There are no active problems to display for this patient.  Past Medical History:  Diagnosis Date  . Arthritis   . Chronic back pain   . Environmental  allergies     No family history on file.  Past Surgical History:  Procedure Laterality Date  . LUMBAR LAMINECTOMY/DECOMPRESSION MICRODISCECTOMY N/A 07/20/2016   Procedure: Right L4-5 Microdiscectomy;  Surgeon: Eldred Manges, MD;  Location: [facility] OR;  Service: Orthopedics;  Laterality: N/A;  . NO PAST SURGERIES     Social History   Occupational History  . Not on file.   Social History Main Topics  . Smoking status: Current Every Day Smoker    Packs/day: 0.50  . Smokeless tobacco: Never Used  . Alcohol use Yes     Comment: 2 beers daily  . Drug use: No  . Sexual activity: Not on file

## 2017-03-01 ENCOUNTER — Ambulatory Visit (INDEPENDENT_AMBULATORY_CARE_PROVIDER_SITE_OTHER): Payer: Self-pay | Admitting: Orthopaedic Surgery

## 2017-06-29 ENCOUNTER — Other Ambulatory Visit: Payer: Self-pay

## 2017-06-29 ENCOUNTER — Emergency Department (HOSPITAL_COMMUNITY)
Admission: EM | Admit: 2017-06-29 | Discharge: 2017-06-29 | Disposition: A | Discharge status: Home / Self Care | Payer: Medicaid Other | Attending: Emergency Medicine | Admitting: Emergency Medicine

## 2017-06-29 ENCOUNTER — Encounter (HOSPITAL_COMMUNITY): Payer: Self-pay

## 2017-06-29 DIAGNOSIS — M25551 Pain in right hip: Secondary | ICD-10-CM | POA: Diagnosis present

## 2017-06-29 DIAGNOSIS — F17200 Nicotine dependence, unspecified, uncomplicated: Secondary | ICD-10-CM | POA: Diagnosis not present

## 2017-06-29 DIAGNOSIS — Z8739 Personal history of other diseases of the musculoskeletal system and connective tissue: Secondary | ICD-10-CM | POA: Diagnosis not present

## 2017-06-29 DIAGNOSIS — M79604 Pain in right leg: Secondary | ICD-10-CM | POA: Diagnosis not present

## 2017-06-29 DIAGNOSIS — Z79899 Other long term (current) drug therapy: Secondary | ICD-10-CM | POA: Diagnosis not present

## 2017-06-29 MED ORDER — DEXAMETHASONE 4 MG PO TABS: 4.0000 mg | ORAL_TABLET | Freq: Two times a day (BID) | ORAL | 0 refills | Status: DC

## 2017-06-29 MED ORDER — ACETAMINOPHEN 500 MG PO TABS
1000.0000 mg | ORAL_TABLET | Freq: Once | ORAL | Status: AC
  Administered 2017-06-29: 1000 mg via ORAL
  Filled 2017-06-29: qty 2

## 2017-06-29 MED ORDER — CYCLOBENZAPRINE HCL 10 MG PO TABS: 10.0000 mg | ORAL_TABLET | Freq: Three times a day (TID) | ORAL | 0 refills | Status: DC

## 2017-06-29 MED ORDER — ONDANSETRON HCL 4 MG PO TABS
4.0000 mg | ORAL_TABLET | Freq: Once | ORAL | Status: AC
  Administered 2017-06-29: 4 mg via ORAL
  Filled 2017-06-29: qty 1

## 2017-06-29 MED ORDER — KETOROLAC TROMETHAMINE 10 MG PO TABS
10.0000 mg | ORAL_TABLET | Freq: Once | ORAL | Status: AC
  Administered 2017-06-29: 10 mg via ORAL
  Filled 2017-06-29: qty 1

## 2017-06-29 MED ORDER — DICLOFENAC SODIUM 75 MG PO TBEC: 75.0000 mg | DELAYED_RELEASE_TABLET | Freq: Two times a day (BID) | ORAL | 0 refills | Status: DC

## 2017-06-29 MED ORDER — DEXAMETHASONE SODIUM PHOSPHATE 10 MG/ML IJ SOLN
10.0000 mg | Freq: Once | INTRAMUSCULAR | Status: AC
  Administered 2017-06-29: 10 mg via INTRAMUSCULAR
  Filled 2017-06-29: qty 1

## 2017-06-29 NOTE — Discharge Instructions (Signed)
Your blood pressure is slightly elevated at 157/94.  Please see your physician for recheck of this pressure.  Please have your doctor keep a close monitor of the possible partial obstruction in your leg.  Today's problem seems to be more of a spasm, and inflammatory process.  Please please use Flexeril 3 times daily, Decadron and diclofenac 2 times daily with food.  Flexeril may cause drowsiness, please do not drive, operate machinery, drink alcohol, or participate in activities requiring concentration when taking this medication.

## 2017-06-29 NOTE — ED Provider Notes (Signed)
[facility] EMERGENCY DEPARTMENT Provider Note   CSN: [id number] Arrival date & time: 06/29/17  [id number]     History   Chief Complaint Chief Complaint  Patient presents with  . Leg Pain    RIGHT    HPI Patrick Aguirre is a 57 y.o. male.  Patient is a 57 year old male who presents to the emergency department with a complaint of hip and leg area pain.  Patient states that he has chronic problems with his back.  He has had back surgery in the past.  He states that he now has problems from the right hip extending down toward the thigh and knee area.  States is worse when the weather changes.  The patient also states that he is noticing that when he walks more than a block that his leg gives him even more problems.  He was told by 1 of his physicians that he may have a partial blockage in his leg and he is in the midst of having this evaluated.  He presents now because he says the pain is getting worse and worse and he is seeking some relief.   The history is provided by the patient.  Leg Pain      Past Medical History:  Diagnosis Date  . Arthritis   . Chronic back pain   . Environmental allergies     There are no active problems to display for this patient.   Past Surgical History:  Procedure Laterality Date  . LUMBAR LAMINECTOMY/DECOMPRESSION MICRODISCECTOMY N/A 07/20/2016   Procedure: Right L4-5 Microdiscectomy;  Surgeon: Eldred MangesMark C Yates, MD;  [facility];  Service: Orthopedics;  Laterality: N/A;  . NO PAST SURGERIES         Home Medications    Prior to Admission medications   Medication Sig Start Date End Date Taking? Authorizing Provider  acetaminophen (TYLENOL) 500 MG tablet Take 500 mg by mouth every 6 (six) hours as needed.    [provider]  HYDROcodone-acetaminophen (NORCO) 10-325 MG tablet Take 1 tablet by mouth every 6 (six) hours as needed. Patient not taking: Reported on 08/24/2016 07/27/16   Naida Sleightwens, James M, PA-C  methocarbamol (ROBAXIN) 500 MG tablet  Take 1 tablet (500 mg total) by mouth every 6 (six) hours as needed for muscle spasms. Patient not taking: Reported on 08/24/2016 07/21/16   Eldred MangesYates, Mark C, MD  oxyCODONE-acetaminophen (ROXICET) 5-325 MG tablet Take 1-2 tablets by mouth every 6 (six) hours as needed for severe pain. Patient not taking: Reported on 08/24/2016 07/21/16   Eldred MangesYates, Mark C, MD  predniSONE (DELTASONE) 20 MG tablet Take 1 tablet (20 mg total) by mouth 2 (two) times daily. 12/13/16   Mancel BaleWentz, Elliott, MD    Family History No family history on file.  Social History Social History   Tobacco Use  . Smoking status: Current Every Day Smoker    Packs/day: 0.50  . Smokeless tobacco: Never Used  Substance Use Topics  . Alcohol use: Yes    Comment: 2 beers daily  . Drug use: No     Allergies   No known allergies   Review of Systems Review of Systems  Constitutional: Negative for activity change.       All ROS Neg except as noted in HPI  HENT: Negative for nosebleeds.   Eyes: Negative for photophobia and discharge.  Respiratory: Negative for cough, shortness of breath and wheezing.   Cardiovascular: Negative for chest pain and palpitations.  Gastrointestinal: Negative for abdominal pain and blood  in stool.  Genitourinary: Negative for dysuria, frequency and hematuria.  Musculoskeletal: Positive for arthralgias and back pain. Negative for neck pain.  Skin: Negative.   Neurological: Negative for dizziness, seizures and speech difficulty.  Psychiatric/Behavioral: Negative for confusion and hallucinations.     Physical Exam Updated Vital Signs BP (!) 157/94 (BP Location: Right Arm)   Pulse 80   Temp 98 F (36.7 C) (Oral)   Resp 18   Ht 5\' 11"  (1.803 m)   Wt 93 kg (205 lb)   SpO2 97%   BMI 28.59 kg/m   Physical Exam  Constitutional: He is oriented to person, place, and time. He appears well-developed and well-nourished.  Non-toxic appearance.  HENT:  Head: Normocephalic.  Right Ear: Tympanic membrane and  external ear normal.  Left Ear: Tympanic membrane and external ear normal.  Eyes: EOM and lids are normal. Pupils are equal, round, and reactive to light.  Neck: Normal range of motion. Neck supple. Carotid bruit is not present.  Cardiovascular: Normal rate, regular rhythm, normal heart sounds, intact distal pulses and normal pulses.  Pulmonary/Chest: Breath sounds normal. No respiratory distress.  Abdominal: Soft. Bowel sounds are normal. There is no tenderness. There is no guarding.  Musculoskeletal:       Right hip: He exhibits decreased range of motion and tenderness.       Right upper leg: He exhibits no swelling, no edema and no deformity.  Pain of the right leg with standing or walking.  Minimal pain to palpation below the right hip.  There is no tenderness or mass behind the right knee.  There is no excessive swelling of the right compared to the left lower extremity.  No pitting edema appreciated.  No temperature changes of the right versus the left.  Lymphadenopathy:       Head (right side): No submandibular adenopathy present.       Head (left side): No submandibular adenopathy present.    He has no cervical adenopathy.  Neurological: He is alert and oriented to person, place, and time. He has normal strength. No cranial nerve deficit or sensory deficit.  Skin: Skin is warm and dry.  Psychiatric: He has a normal mood and affect. His speech is normal.  Nursing note and vitals reviewed.    ED Treatments / Results  Labs (all labs ordered are listed, but only abnormal results are displayed) Labs Reviewed - No data to display  EKG  EKG Interpretation None       Radiology No results found.  Procedures Procedures (including critical care time)  Medications Ordered in ED Medications  dexamethasone (DECADRON) injection 10 mg (not administered)  ketorolac (TORADOL) tablet 10 mg (not administered)  acetaminophen (TYLENOL) tablet 1,000 mg (not administered)  ondansetron  (ZOFRAN) tablet 4 mg (not administered)     Initial Impression / Assessment and Plan / ED Course  I have reviewed the triage vital signs and the nursing notes.  Pertinent labs & imaging results that were available during my care of the patient were reviewed by me and considered in my medical decision making (see chart for details).       Final Clinical Impressions(s) / ED Diagnoses  MDM Blood pressure is elevated at 157/94, otherwise vital signs within normal limits.  The patient has pain with attempted range of motion of the right hip and lower extremity.  There is no temperature change, no swelling appreciated. Suspect inflammatory process, and/or residual pain from right L4-L5 ruptured disc.  Patient treated  in the emergency department with intramuscular Decadron and oral Toradol.  The patient will be given a prescription for Flexeril, diclofenac, and Decadron.  Patient is advised to see his Medicaid access physician or his specialist to monitor this pain and assist with his pain management.   Final diagnoses:  Right leg pain  History of degenerative disc disease    ED Discharge Orders    None       Ivery Quale, PA-C 06/29/17 1610    Loren Racer, MD 06/29/17 [phone]

## 2017-06-29 NOTE — ED Triage Notes (Signed)
Patient reports of having back surgery in March of 2018. States 1 month after pain started in right hip that radiates down right leg. Denies injury.

## 2017-10-19 ENCOUNTER — Emergency Department (HOSPITAL_COMMUNITY)
Admission: EM | Admit: 2017-10-19 | Discharge: 2017-10-19 | Disposition: A | Discharge status: Home / Self Care | Payer: Medicaid Other | Attending: Emergency Medicine | Admitting: Emergency Medicine

## 2017-10-19 ENCOUNTER — Other Ambulatory Visit: Payer: Self-pay

## 2017-10-19 ENCOUNTER — Encounter (HOSPITAL_COMMUNITY): Payer: Self-pay | Admitting: Emergency Medicine

## 2017-10-19 DIAGNOSIS — F17210 Nicotine dependence, cigarettes, uncomplicated: Secondary | ICD-10-CM | POA: Diagnosis not present

## 2017-10-19 DIAGNOSIS — Z7982 Long term (current) use of aspirin: Secondary | ICD-10-CM | POA: Diagnosis not present

## 2017-10-19 DIAGNOSIS — Z79899 Other long term (current) drug therapy: Secondary | ICD-10-CM | POA: Insufficient documentation

## 2017-10-19 DIAGNOSIS — L03116 Cellulitis of left lower limb: Secondary | ICD-10-CM | POA: Diagnosis not present

## 2017-10-19 DIAGNOSIS — M79672 Pain in left foot: Secondary | ICD-10-CM | POA: Diagnosis present

## 2017-10-19 MED ORDER — CEPHALEXIN 500 MG PO CAPS: 500.0000 mg | ORAL_CAPSULE | Freq: Three times a day (TID) | ORAL | 0 refills | Status: DC

## 2017-10-19 NOTE — ED Triage Notes (Signed)
R foot red and swollen since Tuesday

## 2017-10-19 NOTE — Discharge Instructions (Signed)
Take Tylenol as directed for pain. Elevate your foot above your heart as much as possible. Take the antibiotic as prescribed starting today. See an urgent care center or your primary care physician in [place] if your foot is not improving within the next 3 or 4 days. Return to the emergency department for fever vomiting or if you feel worse for any reason. Your blood pressure should be rechecked within the next 3 weeks. Today's was elevated at 146/82

## 2017-10-19 NOTE — ED Provider Notes (Addendum)
[facility] EMERGENCY DEPARTMENT Provider Note   CSN: [id number] Arrival date & time: 10/19/17  1340     History   Chief Complaint Chief Complaint  Patient presents with  . Foot Pain    HPI Vyncent Overby is a 57 y.o. male.  HPI complains of left foot pain and swelling onset2 days ago. (Not right foot painin contradiction to nurse's notes.) symptoms accompanied by 4 redness. Pain is mild. Nothing makes symptoms better or worse. He thinks he may been bitten by an insect in his sleep but has no recall being bitten  . No other associated symptoms. No fever no nausea or vomiting. No treatment prior to coming here.pain is mild. No treatment prior to coming here      Past Medical History:  Diagnosis Date  . Arthritis   . Chronic back pain   . Environmental allergies     There are no active problems to display for this patient.   Past Surgical History:  Procedure Laterality Date  . LUMBAR LAMINECTOMY/DECOMPRESSION MICRODISCECTOMY N/A 07/20/2016   Procedure: Right L4-5 Microdiscectomy;  Surgeon: Eldred Manges, MD;  Location: [facility] OR;  Service: Orthopedics;  Laterality: N/A;  . NO PAST SURGERIES          Home Medications    Prior to Admission medications   Medication Sig Start Date End Date Taking? Authorizing Provider  acetaminophen (TYLENOL) 500 MG tablet Take 500 mg by mouth every 6 (six) hours as needed.   Yes [provider]  aspirin EC 81 MG tablet Take 81 mg by mouth daily.   Yes [provider]  cilostazol (PLETAL) 100 MG tablet Take 1 tablet by mouth 2 (two) times daily. 08/30/17  Yes [provider]  fexofenadine (ALLEGRA) 180 MG tablet Take 180 mg by mouth daily.   Yes [provider]  lisinopril (PRINIVIL,ZESTRIL) 10 MG tablet Take 1 tablet by mouth daily. 08/30/17  Yes [provider]   reports he currently takes no medications  Family History No family history on file.  Social History Social History   Tobacco Use    . Smoking status: Current Every Day Smoker    Packs/day: 0.50  . Smokeless tobacco: Never Used  Substance Use Topics  . Alcohol use: Yes    Comment: 2 beers daily  . Drug use: No     Allergies   No known allergies   Review of Systems Review of Systems  Constitutional: Negative.   Gastrointestinal: Negative for nausea.  Musculoskeletal: Positive for arthralgias.       Left foot pain and swelling  Skin: Positive for color change.       Redness at left foot     Physical Exam Updated Vital Signs BP 140/83 (BP Location: Right Arm)   Pulse 100   Temp 98.3 F (36.8 C) (Oral)   Resp 18   Ht  (1.803 m)   Wt 95.7 kg (211 lb)   SpO2 95%   BMI 29.43 kg/m   Physical Exam  Constitutional: He is oriented to person, place, and time. He appears well-developed and well-nourished. No distress.  HENT:  Head: Normocephalic and atraumatic.  Right Ear: External ear normal.  Left Ear: External ear normal.  Eyes: EOM are normal.  Neck: Neck supple.  Cardiovascular: Normal rate and regular rhythm.  Pulmonary/Chest: Effort normal and breath sounds normal.  Abdominal: He exhibits no distension.  Musculoskeletal:  Left lower extremity foot is mildly swollen and tender at dorsum. Dorsum of  foot is reddenedand warm in comparison to right foot. No fluctuance. "Mild corresponding tenderness.,overlying redness. DP pulse 2+. Good capillary refill. No red streaks proximally. All other extremities without redness swelling or tenderness neurovascular intact  Neurological: He is alert and oriented to person, place, and time.  Skin: Skin is warm and dry. Capillary refill takes less than 2 seconds. No rash noted.  Redness at dorsum of left foot otherwise without rash  Psychiatric: He has a normal mood and affect.  Nursing note and vitals reviewed.    ED Treatments / Results  Labs (all labs ordered are listed, but only abnormal results are displayed) Labs Reviewed - No data to  display  EKG None  Radiology No results found.  Procedures Procedures (including critical care time)  Medications Ordered in ED Medications - No data to display   Initial Impression / Assessment and Plan / ED Course  I have reviewed the triage vital signs and the nursing notes.  Pertinent labs & imaging results that were available during my care of the patient were reviewed by me and considered in my medical decision making (see chart for details).     plan prescription Keflex. Patientis current on tetanus immunization. Follow-up with PMD in [place] if not improving in 3 or 4 days. Return precautions given for worsening redness, nausea vomiting or feels worse for any reason. Blood pressure recheck 3 weeks  Final Clinical Impressions(s) / ED Diagnoses  Dx #1Cellulitis of left foot #2elevated blood pressure Final diagnoses:  None    ED Discharge Orders    None       Doug Sou, MD 10/19/17 1440    Doug Sou, MD 10/19/17 1441

## 2017-10-19 NOTE — ED Notes (Signed)
Dr Ethelda Chick notified of vital signs.

## 2017-10-23 ENCOUNTER — Other Ambulatory Visit: Payer: Self-pay | Admitting: Cardiology

## 2017-10-23 DIAGNOSIS — R748 Abnormal levels of other serum enzymes: Secondary | ICD-10-CM

## 2017-10-27 ENCOUNTER — Other Ambulatory Visit: Payer: Medicaid Other

## 2017-11-01 ENCOUNTER — Ambulatory Visit
Admission: RE | Admit: 2017-11-01 | Discharge: 2017-11-01 | Disposition: A | Discharge status: Home / Self Care | Payer: Medicaid Other | Source: Ambulatory Visit | Attending: Cardiology | Admitting: Cardiology

## 2017-11-01 DIAGNOSIS — R748 Abnormal levels of other serum enzymes: Secondary | ICD-10-CM

## 2017-12-04 DIAGNOSIS — I739 Peripheral vascular disease, unspecified: Secondary | ICD-10-CM | POA: Diagnosis present

## 2017-12-04 NOTE — H&P (Signed)
OFFICE VISIT NOTES COPIED TO EPIC FOR DOCUMENTATION  . History of Present Illness Laverda Page MD; 12/02/2017 7:38 AM) Patient words: fu liver/abdominal ultrasound, le duplex; last OV 10/23/17.  The patient is a 57 year old male who presents for a follow-up for Peripheral vascular disease. Mr. Ozro Russett is an AAM with only known PMH for chronic tobacco use and chronic back pain s/p L4-5 microdisectomy in Feb 2018. Recently found to have abnormal vascular exam with Orthopedist that prompted him to see [place]. Underwent ABI showing moderatley decreased perfusion of right lower extremity and normal lexiscan nuclear stress test and presents for follow up.  Started on DAPT therapy at last office visit. Continues to have significant claudication symptoms that improves with resting. No other symptoms. Denies chest pain or shortness of breath. Of note, he has been smoking 3/4 ppd for 30 years, is cutting down and now at 1/2 ppd, wants to quit. Admits to drinking 3-4 beers a day for several years. He has made lifestyle changes since our visit last time a month ago, continues to have right leg claudication. He has reduced smoking from one half pack every day to one half pack every other day and also has reduced his alcohol intake.   Problem List/Past Medical Georgeanna Harrison; 11/27/2017 1:36 PM) Screening for ischemic heart disease (Z13.6) [08/30/2017]: Lexiscan myoview stress test 09/18/2017: 1. The resting electrocardiogram demonstrated normal sinus rhythm, poor R progression, normal resting conduction, no resting arrhythmias and non-specific T changes. Stress EKG is non-diagnostic for ischemia as it a pharmacologic stress using Lexiscan. The patient developed significant symptoms which included Flushing. 2. LV is dilated both at rest and stress images. The LV end diastolic volume was 616 mL. Stress and rest SPECT images demonstrate homogeneous tracer distribution throughout the myocardium. Gated SPECT  imaging reveals normal myocardial thickening and wall motion. The left ventricular ejection fraction was low normal (53%). This is a low risk study. Chronic back pain greater than 3 months duration (M54.9)  Tobacco use disorder (F17.200)  1/2 PPD Atherosclerotic peripheral vascular disease with intermittent claudication (I70.219)  EKG 08/30/2017: Sinus rhythm at rate of 83 bpm, normal axis. No evidence of ischemia, normal EKG. Laboratory examination (Z01.89)  Labs 09/08/2017: Total cholesterol 175, triglycerides 43, HDL 84, LDL 82. TSH normal. Serum glucose 92 mg, BUN 7, creatinine in 0.80, eGFR > 60 ML, sodium 133, AST 107, ALT 82. HB 13.0/HCT 36.4, mildly reduced. RBC count mildly reduced at 3.97. Otherwise normal indicis. Normal RDW. Platelets 207. Labs 07/19/2016: Potassium 4.2, BUN 8, creatinine 1.09, EGFR greater than 60 mL, serum glucose 97 mg. HB 14.8/HCT 42.7, platelets 261. Alcohol abuse (F10.10)  Elevated liver enzymes (R74.8)  US abdomen 10/31/2017: 1. Diffuse Fatty infiltration of the liver. 2. No acute or suspicious findings.  Allergies Georgeanna Harrison; 11/27/2017 1:36 PM) No Known Drug Allergies [08/30/2017]:  Family History Georgeanna Harrison; 11/27/2017 1:36 PM) Mother  In stable health. nkhc Father  Deceased. unsure of age nkhc Sister 1  older, does have some heart issues unsure as to what Brother 5  younger, older nkhc 2 deceased  Social History Georgeanna Harrison; 11/27/2017 1:36 PM) Current tobacco use  Current some day smoker. 1/2 pk daily Alcohol Use  Moderate alcohol use. 2 beers a day Marital status  Married. Living Situation  Lives with spouse. Number of Children  1. daughter  Past Surgical History Georgeanna Harrison; 11/27/2017 1:36 PM) Back Surgery [07/2016]:  Medication History Laverda Page, MD; 12/02/2017 7:38 AM) Cilostazol (100MG  Tablet, 1 (one) Tablet Tablet Oral two times daily, Taken starting 08/30/2017) Active. Aspirin 81 (81MG Tablet Chewable,  1 (one) Tablet Tablet Oral daily, Taken starting 08/30/2017) Active. Lisinopril (10MG Tablet, 1 (one) Tablet Tablet Oral daily, Taken starting 08/30/2017) Active. Medications Reconciled (Pt brought medications)  Diagnostic Studies History Georgeanna Harrison; 13-Dec-2017 1:45 PM) Doppler Ultrasound [08/30/2017]: Lower extremity arterial duplex 11/21/2017: Monophasic waveform throughout the right lower extremity suggests significant proximal stenosis (Iliac artery). No hemodynamically significant stenoses are identified in the left lower extremity arterial system. Severely abnormal waveforms of the right ankle. Mildly abnormal waveforms of the left ankle. Moderately decreased right resting ABI at 0.52 and mildly decreased left resting ABI 0.92. Nuclear stress test  Lexiscan myoview stress test 09/18/2017: 1. The resting electrocardiogram demonstrated normal sinus rhythm, poor R progression, normal resting conduction, no resting arrhythmias and non-specific T changes. Stress EKG is non-diagnostic for ischemia as it a pharmacologic stress using Lexiscan. The patient developed significant symptoms which included Flushing. 2. LV is dilated both at rest and stress images. The LV end diastolic volume was 185 mL. Stress and rest SPECT images demonstrate homogeneous tracer distribution throughout the myocardium. Gated SPECT imaging reveals normal myocardial thickening and wall motion. The left ventricular ejection fraction was low normal (53%). This is a low risk study.    Review of Systems Laverda Page, MD; Dec 13, 2017 2:58 PM) General Not Present- Appetite Loss and Weight Gain. Respiratory Not Present- Chronic Cough and Wakes up from Sleep Wheezing or Short of Breath. Cardiovascular Present- Claudications (R thigh) and Leg Cramps (R thigh). Not Present- Chest Pain, Difficulty Breathing Lying Down, Difficulty Breathing On Exertion, Edema, Orthopnea, Palpitations, Paroxysmal Nocturnal Dyspnea and  Swelling of Extremities. Gastrointestinal Not Present- Black, Tarry Stool and Difficulty Swallowing. Musculoskeletal Not Present- Decreased Range of Motion and Muscle Atrophy. Neurological Not Present- Attention Deficit. Psychiatric Not Present- Personality Changes and Suicidal Ideation. Endocrine Not Present- Cold Intolerance and Heat Intolerance. Hematology Not Present- Abnormal Bleeding. All other systems negative  Vitals Georgeanna Harrison; 12/13/2017 1:49 PM) Dec 13, 2017 1:43 PM Weight: 211.44 lb Height: 71in Body Surface Area: 2.16 m Body Mass Index: 29.49 kg/m  Pulse: 69 (Regular)  P.OX: 98% (Room air) BP: 142/74 (Sitting, Left Arm, Standard)       Physical Exam Laverda Page, MD; 13-Dec-2017 2:58 PM) General Mental Status-Alert. General Appearance-Cooperative and Appears stated age. Build & Nutrition-Moderately built.  Head and Neck Thyroid Gland Characteristics - normal size and consistency and no palpable nodules.  Chest and Lung Exam Chest and lung exam reveals -quiet, even and easy respiratory effort with no use of accessory muscles, non-tender and on auscultation, normal breath sounds, no adventitious sounds.  Cardiovascular Cardiovascular examination reveals -normal heart sounds, regular rate and rhythm with no murmurs, carotid auscultation reveals no bruits and abdominal aorta auscultation reveals no bruits and no prominent pulsation.  Abdomen Palpation/Percussion Normal exam - Non Tender and No hepatosplenomegaly.  Peripheral Vascular Lower Extremity Inspection - Bilateral - Thick rigid nails, No Cyanotic nailbeds, Not Ischemic. Palpation - Edema - Bilateral - No edema. Femoral pulse - Left - 2+. Right - Feeble. Popliteal pulse - Left - 2+. Right - Absent. Dorsalis pedis pulse - Left - 2+. Right - Absent. Posterior tibial pulse - Left - 2+. Right - Absent. Auscultation - Left Groin Bruit Present(soft), No Right Groin Bruit  Present.  Neurologic Neurologic evaluation reveals -alert and oriented x 3 with no impairment of recent or remote memory. Motor-Grossly intact without any focal deficits.  Musculoskeletal Global Assessment Left Lower Extremity - no deformities, masses or tenderness, no known fractures. Right Lower Extremity - no deformities, masses or tenderness, no known fractures.    Assessment & Plan Laverda Page MD; 12/02/2017 7:37 AM) Atherosclerotic peripheral vascular disease with intermittent claudication (I70.219) Story: EKG 08/30/2017: Sinus rhythm at rate of 83 bpm, normal axis. No evidence of ischemia, normal EKG. Impression: ABI 10/17/2017: This exam reveals moderately decreased perfusion of the right lower extremity, noted at the dorsalis pedis and post tibial artery level (ABI 0.55) with monophasic waveform and mildly decreased perfusion of the left lower extremity, noted at the dorsalis pedis artery level (ABI 0.93) with triphasic waveform.  Lower extremity arterial duplex 11/21/2017: Monophasic waveform throughout the right lower extremity suggests significant proximal stenosis (Iliac artery). No hemodynamically significant stenoses are identified in the left lower extremity arterial system. Severely abnormal waveforms of the right ankle. Mildly abnormal waveforms of the left ankle. Moderately decreased right resting ABI at 0.52 and mildly decreased left resting ABI 0.92. Current Plans Started Crestor 10MG, 1 (one) Tablet daily, #30, 30 days starting 11/27/2017, Ref. x2. METABOLIC PANEL, COMPREHENSIVE (35701) CBC & PLATELETS (AUTO) (77939) Tobacco use disorder (F17.200) Story: 1/2 Pack every two days, reduced from 1/2 PPD Alcohol abuse (F10.10) Elevated liver enzymes (R74.8) Story: US abdomen 10/31/2017: 1. Diffuse Fatty infiltration of the liver. 2. No acute or suspicious findings. Screening for ischemic heart disease (Z13.6) Story: Lexiscan myoview stress test  09/18/2017: 1. The resting electrocardiogram demonstrated normal sinus rhythm, poor R progression, normal resting conduction, no resting arrhythmias and non-specific T changes. Stress EKG is non-diagnostic for ischemia as it a pharmacologic stress using Lexiscan. The patient developed significant symptoms which included Flushing. 2. LV is dilated both at rest and stress images. The LV end diastolic volume was 030 mL. Stress and rest SPECT images demonstrate homogeneous tracer distribution throughout the myocardium. Gated SPECT imaging reveals normal myocardial thickening and wall motion. The left ventricular ejection fraction was low normal (53%). This is a low risk study. Laboratory examination (Z01.89) Story: Labs 09/08/2017: Total cholesterol 175, triglycerides 43, HDL 84, LDL 82. TSH normal. Serum glucose 92 mg, BUN 7, creatinine in 0.80, eGFR > 60 ML, sodium 133, AST 107, ALT 82. HB 13.0/HCT 36.4, mildly reduced. RBC count mildly reduced at 3.97. Otherwise normal indicis. Normal RDW. Platelets 207.  Labs 07/19/2016: Potassium 4.2, BUN 8, creatinine 1.09, EGFR greater than 60 mL, serum glucose 97 mg. HB 14.8/HCT 42.7, platelets 261.  Note:. Recommendations:  Mr. Damarkus Balis is an AAM with PMH for chronic tobacco use and chronic back pain s/p L4-5 microdisectomy in Feb 2018. He does not follow with a PCP, he was told by his orthopedist that he may have a blocked artery in R leg.  Patient is here on a one-month follow-up, continues to have right leg claudication. He has reduced smoking from one half pack every day to one half pack every other day and also has reduced his alcohol intake significantly. He appears to be very motivated in making lifestyle changes. He is tolerating medications well including aspirin and Pletal and lisinopril. He has not had any side effects. We will continue the same.  He is ultrasound of the abdomen reveals fatty liver. I'll start him on Crestor 10 mg daily for  revascularization protection. Due to severe symptoms of claudication, in spite of aggressive medical therapy, I'll set him up for the referral arteriogram and see him back after this. He will need lipid profile  testing along with CMP in 2-3 months. He is aware of Crestor can cause worsening liver dysfunction. I am pleased with the progress he has made with lifestyle change.  His wife is present at the bedside, fatty liver and the implications of the same discussed.    Signed by Laverda Page, MD (12/02/2017 7:38 AM)

## 2017-12-05 ENCOUNTER — Ambulatory Visit (HOSPITAL_COMMUNITY)
Admission: RE | Admit: 2017-12-05 | Discharge: 2017-12-05 | Disposition: A | Discharge status: Home / Self Care | Payer: Medicaid Other | Source: Ambulatory Visit | Attending: Cardiology | Admitting: Cardiology

## 2017-12-05 ENCOUNTER — Encounter (HOSPITAL_COMMUNITY)
Admission: RE | Disposition: A | Discharge status: Home / Self Care | Payer: Self-pay | Source: Ambulatory Visit | Attending: Cardiology

## 2017-12-05 DIAGNOSIS — F17210 Nicotine dependence, cigarettes, uncomplicated: Secondary | ICD-10-CM | POA: Diagnosis not present

## 2017-12-05 DIAGNOSIS — Z7982 Long term (current) use of aspirin: Secondary | ICD-10-CM | POA: Diagnosis not present

## 2017-12-05 DIAGNOSIS — M549 Dorsalgia, unspecified: Secondary | ICD-10-CM | POA: Diagnosis not present

## 2017-12-05 DIAGNOSIS — G8929 Other chronic pain: Secondary | ICD-10-CM | POA: Insufficient documentation

## 2017-12-05 DIAGNOSIS — F1010 Alcohol abuse, uncomplicated: Secondary | ICD-10-CM | POA: Insufficient documentation

## 2017-12-05 DIAGNOSIS — K760 Fatty (change of) liver, not elsewhere classified: Secondary | ICD-10-CM | POA: Diagnosis not present

## 2017-12-05 DIAGNOSIS — I739 Peripheral vascular disease, unspecified: Secondary | ICD-10-CM | POA: Diagnosis present

## 2017-12-05 DIAGNOSIS — I7092 Chronic total occlusion of artery of the extremities: Secondary | ICD-10-CM | POA: Diagnosis not present

## 2017-12-05 DIAGNOSIS — I70211 Atherosclerosis of native arteries of extremities with intermittent claudication, right leg: Secondary | ICD-10-CM | POA: Diagnosis not present

## 2017-12-05 HISTORY — PX: PERIPHERAL VASCULAR INTERVENTION: CATH118257

## 2017-12-05 HISTORY — PX: LOWER EXTREMITY ANGIOGRAPHY: CATH118251

## 2017-12-05 HISTORY — PX: PERIPHERAL VASCULAR ATHERECTOMY: CATH118256

## 2017-12-05 LAB — POCT ACTIVATED CLOTTING TIME
Activated Clotting Time: 213 seconds
Activated Clotting Time: 158 seconds

## 2017-12-05 SURGERY — LOWER EXTREMITY ANGIOGRAPHY
Anesthesia: LOCAL

## 2017-12-05 MED ORDER — IODIXANOL 320 MG/ML IV SOLN
INTRAVENOUS | Status: DC | PRN
  Administered 2017-12-05: 190 mL via INTRA_ARTERIAL

## 2017-12-05 MED ORDER — HEPARIN (PORCINE) IN NACL 1000-0.9 UT/500ML-% IV SOLN
INTRAVENOUS | Status: DC | PRN
  Administered 2017-12-05 (×2): 500 mL

## 2017-12-05 MED ORDER — HEPARIN (PORCINE) IN NACL 1000-0.9 UT/500ML-% IV SOLN
INTRAVENOUS | Status: AC
  Filled 2017-12-05: qty 500

## 2017-12-05 MED ORDER — CLOPIDOGREL BISULFATE 75 MG PO TABS: 75.0000 mg | ORAL_TABLET | Freq: Every day | ORAL | 11 refills | Status: AC

## 2017-12-05 MED ORDER — SODIUM CHLORIDE 0.9 % IV SOLN: 250.0000 mL | INTRAVENOUS | Status: DC | PRN

## 2017-12-05 MED ORDER — LIDOCAINE HCL (PF) 1 % IJ SOLN
INTRAMUSCULAR | Status: AC
  Filled 2017-12-05: qty 30

## 2017-12-05 MED ORDER — HYDROMORPHONE HCL 1 MG/ML IJ SOLN
INTRAMUSCULAR | Status: AC
  Filled 2017-12-05: qty 0.5

## 2017-12-05 MED ORDER — HYDROMORPHONE HCL 1 MG/ML IJ SOLN
INTRAMUSCULAR | Status: DC | PRN
  Administered 2017-12-05 (×4): 0.5 mg via INTRAVENOUS

## 2017-12-05 MED ORDER — SODIUM CHLORIDE 0.9 % IV SOLN
INTRAVENOUS | Status: DC | PRN
  Administered 2017-12-05: 09:00:00 via SURGICAL_CAVITY

## 2017-12-05 MED ORDER — LABETALOL HCL 5 MG/ML IV SOLN: 10.0000 mg | INTRAVENOUS | Status: DC | PRN

## 2017-12-05 MED ORDER — VERAPAMIL HCL 2.5 MG/ML IV SOLN
INTRAVENOUS | Status: AC
  Filled 2017-12-05: qty 2

## 2017-12-05 MED ORDER — SODIUM CHLORIDE 0.9 % IV SOLN: INTRAVENOUS | Status: DC

## 2017-12-05 MED ORDER — HEPARIN SODIUM (PORCINE) 1000 UNIT/ML IJ SOLN
INTRAMUSCULAR | Status: DC | PRN
  Administered 2017-12-05: 3000 [IU] via INTRAVENOUS
  Administered 2017-12-05: 4000 [IU] via INTRAVENOUS

## 2017-12-05 MED ORDER — SODIUM CHLORIDE 0.9% FLUSH: 3.0000 mL | Freq: Two times a day (BID) | INTRAVENOUS | Status: DC

## 2017-12-05 MED ORDER — NITROGLYCERIN 1 MG/10 ML FOR IR/CATH LAB
INTRA_ARTERIAL | Status: DC | PRN
  Administered 2017-12-05: 500 ug via INTRA_ARTERIAL

## 2017-12-05 MED ORDER — LIDOCAINE HCL (PF) 1 % IJ SOLN
INTRAMUSCULAR | Status: DC | PRN
  Administered 2017-12-05: 20 mL via INTRADERMAL
  Administered 2017-12-05: 10 mL via INTRADERMAL

## 2017-12-05 MED ORDER — SODIUM CHLORIDE 0.9 % IV SOLN: INTRAVENOUS | Status: AC

## 2017-12-05 MED ORDER — SODIUM CHLORIDE 0.9% FLUSH: 3.0000 mL | INTRAVENOUS | Status: DC | PRN

## 2017-12-05 MED ORDER — NITROGLYCERIN IN D5W 200-5 MCG/ML-% IV SOLN
INTRAVENOUS | Status: AC
  Filled 2017-12-05: qty 250

## 2017-12-05 MED ORDER — ACETAMINOPHEN 325 MG PO TABS: 650.0000 mg | ORAL_TABLET | ORAL | Status: DC | PRN

## 2017-12-05 MED ORDER — MIDAZOLAM HCL 2 MG/2ML IJ SOLN
INTRAMUSCULAR | Status: DC | PRN
  Administered 2017-12-05: 2 mg via INTRAVENOUS

## 2017-12-05 MED ORDER — HYDRALAZINE HCL 20 MG/ML IJ SOLN
5.0000 mg | INTRAMUSCULAR | Status: DC | PRN
  Administered 2017-12-05: 5 mg via INTRAVENOUS

## 2017-12-05 MED ORDER — ONDANSETRON HCL 4 MG/2ML IJ SOLN: 4.0000 mg | Freq: Four times a day (QID) | INTRAMUSCULAR | Status: DC | PRN

## 2017-12-05 MED ORDER — SODIUM CHLORIDE 0.9 % IV BOLUS
500.0000 mL | Freq: Once | INTRAVENOUS | Status: AC
  Administered 2017-12-05: 500 mL via INTRAVENOUS

## 2017-12-05 MED ORDER — HEPARIN SODIUM (PORCINE) 1000 UNIT/ML IJ SOLN
INTRAMUSCULAR | Status: AC
  Filled 2017-12-05: qty 1

## 2017-12-05 MED ORDER — MIDAZOLAM HCL 2 MG/2ML IJ SOLN
INTRAMUSCULAR | Status: AC
  Filled 2017-12-05: qty 2

## 2017-12-05 MED ORDER — HYDRALAZINE HCL 20 MG/ML IJ SOLN
INTRAMUSCULAR | Status: AC
  Filled 2017-12-05: qty 1

## 2017-12-05 SURGICAL SUPPLY — 35 items
BALLN SAPPHIRE 1.5X20 (BALLOONS) ×3
BALLOON SAPPHIRE 1.5X20 (BALLOONS) ×2 IMPLANT
CATH OMNI FLUSH 5F 65CM (CATHETERS) ×3 IMPLANT
CATH QUICKCROSS SUPP .018X90CM (MICROCATHETER) ×3 IMPLANT
CATH SOFT-VU 4F 65 STRAIGHT (CATHETERS) ×2 IMPLANT
CATH SOFT-VU STRAIGHT 4F 65CM (CATHETERS) ×1
COVER PRB 48X5XTLSCP FOLD TPE (BAG) ×2 IMPLANT
COVER PROBE 5X48 (BAG) ×1
DEVICE CLOSURE PERCLS PRGLD 6F (VASCULAR PRODUCTS) ×2 IMPLANT
DEVICE TORQUE .014-.018 (MISCELLANEOUS) ×2 IMPLANT
DIAMONDBACK SOLID OAS 2.0MM (CATHETERS) ×3
GUIDEWIRE ANGLED .035X150CM (WIRE) ×3 IMPLANT
KIT ENCORE 26 ADVANTAGE (KITS) ×3 IMPLANT
KIT MICROPUNCTURE NIT STIFF (SHEATH) ×3 IMPLANT
KIT PV (KITS) ×3 IMPLANT
LUBRICANT VIPERSLIDE CORONARY (MISCELLANEOUS) ×3 IMPLANT
PERCLOSE PROGLIDE 6F (VASCULAR PRODUCTS) ×3
SHEATH BRITE TIP 7FR 35CM (SHEATH) ×3 IMPLANT
SHEATH BRITE TIP 8FR 35CM (SHEATH) ×3 IMPLANT
SHEATH FLEXOR ANSEL 1 7F 45CM (SHEATH) ×3 IMPLANT
SHEATH PINNACLE 5F 10CM (SHEATH) ×6 IMPLANT
STENT VIABAHNBX 10X59X135 (Permanent Stent) ×3 IMPLANT
STOPCOCK MORSE 400PSI 3WAY (MISCELLANEOUS) ×3 IMPLANT
SYRINGE MEDRAD AVANTA MACH 7 (SYRINGE) ×3 IMPLANT
SYSTEM DIMNDBCK SLD OAS 2.0MM (CATHETERS) ×2 IMPLANT
TAPE VIPERTRACK RADIOPAQ (MISCELLANEOUS) ×2 IMPLANT
TAPE VIPERTRACK RADIOPAQUE (MISCELLANEOUS) ×1
TORQUE DEVICE .014-.018 (MISCELLANEOUS) ×3
TRANSDUCER W/STOPCOCK (MISCELLANEOUS) ×3 IMPLANT
TRAY PV CATH (CUSTOM PROCEDURE TRAY) ×3 IMPLANT
TUBING CIL FLEX 10 FLL-RA (TUBING) ×3 IMPLANT
WIRE APROACH 18G .014X190CM (WIRE) ×3 IMPLANT
WIRE HI TORQ VERSACORE-J 145CM (WIRE) ×3 IMPLANT
WIRE ROSEN-J .035X260CM (WIRE) ×3 IMPLANT
WIRE VIPER ADVANCE .017X335CM (WIRE) ×3 IMPLANT

## 2017-12-05 NOTE — Interval H&P Note (Signed)
History and Physical Interval Note:  12/05/2017 7:39 AM  Patrick Aguirre  has presented today for surgery, with the diagnosis of claudication  The various methods of treatment have been discussed with the patient and family. After consideration of risks, benefits and other options for treatment, the patient has consented to  Procedure(s): LOWER EXTREMITY ANGIOGRAPHY (N/A) and possible angioplasty as a surgical intervention .  The patient's history has been reviewed, patient examined, no change in status, stable for surgery.  I have reviewed the patient's chart and labs.  Questions were answered to the patient's satisfaction.     Yates DecampJay Lizanne Erker

## 2017-12-05 NOTE — Progress Notes (Signed)
375fr sheath aspirated and removed from left femoral artery. Manual pressure applied for 20 minutes. Groin level 0. Right groin which was angiosealed is also level 0. Tegaderm dressing applied to left access site. Bedrest instructions given.  Left dp and pt pulse present with doppler. Right pt present with dopper. Right dp absent. 400 urine output. Blood noted in urine, Dr. Jacinto HalimGanji notified.  Bedrest begins at 10:45:00

## 2017-12-05 NOTE — Discharge Instructions (Signed)

## 2017-12-06 ENCOUNTER — Encounter (HOSPITAL_COMMUNITY): Payer: Self-pay | Admitting: Cardiology

## 2017-12-14 NOTE — Progress Notes (Signed)
I saw the patient in the office today.   He has reduced his smoking to one pack of cigarettes every 3 days, has also reduced his drinking alcohol to one beer a day. He did receive excess ratiation, but clinically no side effects noted. It was a complex procedure. I discussed late effects of radiation.

## 2018-02-15 ENCOUNTER — Encounter (HOSPITAL_COMMUNITY): Payer: Self-pay | Admitting: Emergency Medicine

## 2018-02-15 ENCOUNTER — Emergency Department (HOSPITAL_COMMUNITY): Payer: Medicaid Other

## 2018-02-15 ENCOUNTER — Other Ambulatory Visit: Payer: Self-pay

## 2018-02-15 ENCOUNTER — Emergency Department (HOSPITAL_COMMUNITY)
Admission: EM | Admit: 2018-02-15 | Discharge: 2018-02-15 | Disposition: A | Discharge status: Home / Self Care | Payer: Medicaid Other | Attending: Emergency Medicine | Admitting: Emergency Medicine

## 2018-02-15 DIAGNOSIS — Z79899 Other long term (current) drug therapy: Secondary | ICD-10-CM | POA: Insufficient documentation

## 2018-02-15 DIAGNOSIS — L03019 Cellulitis of unspecified finger: Secondary | ICD-10-CM

## 2018-02-15 DIAGNOSIS — L03012 Cellulitis of left finger: Secondary | ICD-10-CM | POA: Diagnosis present

## 2018-02-15 DIAGNOSIS — Z7982 Long term (current) use of aspirin: Secondary | ICD-10-CM | POA: Diagnosis not present

## 2018-02-15 DIAGNOSIS — F17210 Nicotine dependence, cigarettes, uncomplicated: Secondary | ICD-10-CM | POA: Diagnosis not present

## 2018-02-15 MED ORDER — POVIDONE-IODINE 10 % EX SOLN
CUTANEOUS | Status: AC
  Filled 2018-02-15: qty 15

## 2018-02-15 MED ORDER — LIDOCAINE-EPINEPHRINE 1 %-1:200000 IJ SOLN
10.0000 mL | Freq: Once | INTRAMUSCULAR | Status: AC
Start: 2018-02-15 — End: 2018-02-15
  Administered 2018-02-15: 10 mL via INTRADERMAL
  Filled 2018-02-15: qty 30

## 2018-02-15 MED ORDER — OXYCODONE-ACETAMINOPHEN 5-325 MG PO TABS: 1.0000 | ORAL_TABLET | ORAL | 0 refills | Status: DC | PRN

## 2018-02-15 MED ORDER — DOXYCYCLINE HYCLATE 100 MG PO CAPS: 100.0000 mg | ORAL_CAPSULE | Freq: Two times a day (BID) | ORAL | 0 refills | Status: DC

## 2018-02-15 NOTE — Discharge Instructions (Addendum)
Please make an appointment with the hand doctor (Dr. Romeo Apple) for Monday. I have listed the phone number and address to his office.  Take antibiotic twice a day until all antibiotic is completely gone.  I have also written you prescription for pain medicine, but over this medicine can make you drowsy so please do not drive or work while taking it.  Keep the wound clean and dry, covered with clean dressings every day.  Come back to the ER sooner if you have any new or concerning symptoms like fever or worsening redness/swelling over the finger.

## 2018-02-15 NOTE — ED Notes (Signed)
Offered food and drink. Patient declined.

## 2018-02-15 NOTE — ED Provider Notes (Signed)
[facility] EMERGENCY DEPARTMENT Provider Note   CSN: [id number] Arrival date & time: 02/15/18  4098     History   Chief Complaint Chief Complaint  Patient presents with  . Finger Injury    HPI Patrick Aguirre is a 57 y.o. male.  HPI  Patrick Aguirre is a 57yo male with no significant past medical history who presents to the emergency department for evaluation of left thumb swelling.  Patient reports that he got a blister after laying block 2 weeks ago.  He states that the blister over the thumb has gradually increased in size and has become painful and red.  He tried soaking the thumb and hot salty water and was able to express some pus a few days ago.  He denies fevers, chills, erythema or swelling elsewhere.  No history of diabetes.  Past Medical History:  Diagnosis Date  . Arthritis   . Chronic back pain   . Environmental allergies     Patient Active Problem List   Diagnosis Date Noted  . Claudication in peripheral vascular disease (HCC) 12/04/2017    Past Surgical History:  Procedure Laterality Date  . LOWER EXTREMITY ANGIOGRAPHY N/A 12/05/2017   Procedure: LOWER EXTREMITY ANGIOGRAPHY;  Surgeon: Yates Decamp, MD;  Location: [facility] INVASIVE CV LAB;  Service: Cardiovascular;  Laterality: N/A;  . LUMBAR LAMINECTOMY/DECOMPRESSION MICRODISCECTOMY N/A 07/20/2016   Procedure: Right L4-5 Microdiscectomy;  Surgeon: Eldred Manges, MD;  Location: [facility] OR;  Service: Orthopedics;  Laterality: N/A;  . NO PAST SURGERIES    . PERIPHERAL VASCULAR ATHERECTOMY  12/05/2017   Procedure: PERIPHERAL VASCULAR ATHERECTOMY;  Surgeon: Yates Decamp, MD;  Location: [facility] INVASIVE CV LAB;  Service: Cardiovascular;;  . PERIPHERAL VASCULAR INTERVENTION  12/05/2017   Procedure: PERIPHERAL VASCULAR INTERVENTION;  Surgeon: Yates Decamp, MD;  Location: [facility] INVASIVE CV LAB;  Service: Cardiovascular;;        Home Medications    Prior to Admission medications   Medication Sig Start Date End Date Taking? Authorizing  Provider  aspirin EC 81 MG tablet Take 81 mg by mouth daily.    [provider]  cephALEXin (KEFLEX) 500 MG capsule Take 1 capsule (500 mg total) by mouth 3 (three) times daily. Patient not taking: Reported on 12/01/2017 10/19/17   Doug Sou, MD  clopidogrel (PLAVIX) 75 MG tablet Take 1 tablet (75 mg total) by mouth daily. 12/05/17 12/05/18  Yates Decamp, MD  lisinopril (PRINIVIL,ZESTRIL) 10 MG tablet Take 10 mg by mouth daily.  08/30/17   [provider]  nicotine (NICODERM CQ - DOSED IN MG/24 HR) 7 mg/24hr patch Place 7 mg onto the skin daily.    [provider]  rosuvastatin (CRESTOR) 10 MG tablet Take 10 mg by mouth daily.    [provider]    Family History No family history on file.  Social History Social History   Tobacco Use  . Smoking status: Current Every Day Smoker    Packs/day: 0.50  . Smokeless tobacco: Never Used  Substance Use Topics  . Alcohol use: Yes    Comment: 2 beers daily  . Drug use: No     Allergies   Patient has no known allergies.   Review of Systems Review of Systems  Constitutional: Negative for chills and fever.  Musculoskeletal: Positive for myalgias (left thumb).  Skin: Positive for color change (left thumb).  All other systems reviewed and are negative.    Physical Exam Updated Vital Signs BP 126/72 (BP Location: Right Arm)  Pulse 98   Temp 98.8 F (37.1 C) (Oral)   Resp 18   Ht 5\' 11"  (1.803 m)   Wt 95.3 kg   SpO2 98%   BMI 29.29 kg/m   Physical Exam  Constitutional: He appears well-developed and well-nourished. No distress.  HENT:  Head: Normocephalic and atraumatic.  Eyes: Right eye exhibits no discharge. Left eye exhibits no discharge.  Neck: Normal range of motion.  Cardiovascular: Normal rate and regular rhythm.  Pulmonary/Chest: Effort normal and breath sounds normal. No stridor. No respiratory distress. He has no wheezes. He has no rales.  Abdominal: Soft. There is no tenderness.    Musculoskeletal:  Left thumb swollen, erythematous and tender to the touch. No active drainage.  Neurological: He is alert. Coordination normal.  Skin: Skin is warm and dry. He is not diaphoretic.  Psychiatric: He has a normal mood and affect. His behavior is normal.  Nursing note and vitals reviewed.        ED Treatments / Results  Labs (all labs ordered are listed, but only abnormal results are displayed) Labs Reviewed  AEROBIC CULTURE (SUPERFICIAL SPECIMEN)    EKG None  Radiology Dg Finger Thumb Left  Result Date: 02/15/2018 CLINICAL DATA:  Blister on left thumb EXAM: LEFT THUMB 2+V COMPARISON:  None. FINDINGS: Soft tissue swelling at the tip of the left thumb. Small ulceration noted at the skin surface. No underlying bony abnormality. No radiographic changes of osteomyelitis. No radiopaque foreign bodies. IMPRESSION: Soft tissue swelling without evidence of osteomyelitis or radiopaque foreign body. Electronically Signed   By: Charlett Nose M.D.   On: 02/15/2018 11:26    Procedures Procedures (including critical care time)  Medications Ordered in ED Medications  lidocaine-EPINEPHrine (XYLOCAINE-EPINEPHrine) 1 %-1:200000 (PF) injection 10 mL (has no administration in time range)  povidone-iodine (BETADINE) 10 % external solution (has no administration in time range)     Initial Impression / Assessment and Plan / ED Course  I have reviewed the triage vital signs and the nursing notes.  Pertinent labs & imaging results that were available during my care of the patient were reviewed by me and considered in my medical decision making (see chart for details).     Patient with a felon on the left thumb.  This was drained in the emergency department by hand surgeon Dr. Romeo Apple.  He would like patient to be started on antibiotic and follow-up in his office on Monday 9/30.  Patient prescribed doxycycline to cover for MRSA, wound culture sent to the lab.  Will send home with  Percocet for pain, counseled him on the sedating effects of this medication.  Patient counseled on dressing changes and return precautions.  He agrees and appears reliable.  Final Clinical Impressions(s) / ED Diagnoses   Final diagnoses:  Felon of finger    ED Discharge Orders         Ordered    doxycycline (VIBRAMYCIN) 100 MG capsule  2 times daily     02/15/18 1704    oxyCODONE-acetaminophen (PERCOCET/ROXICET) 5-325 MG tablet  Every 4 hours PRN     02/15/18 1704           Kellie Shropshire, PA-C 02/15/18 1709    Mesner, Barbara Cower, MD 02/20/18 1437

## 2018-02-15 NOTE — ED Notes (Signed)
Ultrasound placed in room as requested by PA.

## 2018-02-15 NOTE — ED Notes (Signed)
Patient ambulated to ER entrance "to stretch my legs."

## 2018-02-15 NOTE — ED Notes (Signed)
I&D set up at bedside for Dr Romeo Apple.

## 2018-02-15 NOTE — ED Notes (Signed)
Gave patient graham crackers and drink. Patient encouraged to eat and drink while waiting on surgeon.

## 2018-02-15 NOTE — ED Notes (Signed)
Dr Harrison at bedside

## 2018-02-15 NOTE — ED Triage Notes (Signed)
Onset 7 days ago, pt got a blister on left thumb after laying block, has been soaking it , continues to get worse, swelling, painful.

## 2018-02-16 ENCOUNTER — Encounter: Payer: Self-pay | Admitting: Orthopedic Surgery

## 2018-02-16 DIAGNOSIS — L03019 Cellulitis of unspecified finger: Secondary | ICD-10-CM | POA: Insufficient documentation

## 2018-02-16 NOTE — Consult Note (Signed)
Patrick Aguirre    Consult in ER    Requested by Dr Lady Saucier   02/15/2018   HISTORY SECTION :   Chief complaint patient has abscess and fell and left thumb   HPI The patient presents for evaluation of pain swelling left thumb.  Apparently 2 weeks ago the patient was laying brick hit his thumb few days later he noticed increased pain swelling and then 2 days ago noticed drainage from the finger with purulent drainage.  He says he cannot sleep at night it throbs pain is severe swelling can use it for his activities of daily living   Location left thumb Duration 2 weeks Quality dull throbbing Severity 10 out of 10 Associated with swelling and purulent drainage decreased range of motion     Review of Systems  Constitutional: Negative for fever.  Musculoskeletal: Positive for joint pain.  Skin: Negative.   All other systems reviewed and are negative.         Past Medical History:  Diagnosis Date  . Arthritis    . Chronic back pain    . Environmental allergies             Past Surgical History:  Procedure Laterality Date  . LOWER EXTREMITY ANGIOGRAPHY N/A 12/05/2017    Procedure: LOWER EXTREMITY ANGIOGRAPHY;  Surgeon: Yates Decamp, MD;  Location: [facility];  Service: Cardiovascular;  Laterality: N/A;  . LUMBAR LAMINECTOMY/DECOMPRESSION MICRODISCECTOMY N/A 07/20/2016    Procedure: Right L4-5 Microdiscectomy;  Surgeon: Eldred Manges, MD;  Location: [facility] OR;  Service: Orthopedics;  Laterality: N/A;  . NO PAST SURGERIES      . PERIPHERAL VASCULAR ATHERECTOMY   12/05/2017    Procedure: PERIPHERAL VASCULAR ATHERECTOMY;  Surgeon: Yates Decamp, MD;  Location: [facility] INVASIVE CV LAB;  Service: Cardiovascular;;  . PERIPHERAL VASCULAR INTERVENTION   12/05/2017    Procedure: PERIPHERAL VASCULAR INTERVENTION;  Surgeon: Yates Decamp, MD;  Location: [facility] INVAS[facility] CV [facility];  Service: Cardiovascular;;      Social History         Tobacco Use  . Smoking status: Current Every Day Smoker      Packs/day:  0.50  . Smokeless tobacco: Never Used  Substance Use Topics  . Alcohol use: Yes      Comment: 2 beers daily  . Drug use: No      No family history on file. There is no family history on file but the patient denies any hypertension or diabetes in his family     No Known Allergies     Current Outpatient Medications:  .  aspirin EC 81 MG tablet, Take 81 mg by mouth daily., Disp: , Rfl:  .  cephALEXin (KEFLEX) 500 MG capsule, Take 1 capsule (500 mg total) by mouth 3 (three) times daily. (Patient not taking: Reported on 12/01/2017), Disp: 21 capsule, Rfl: 0 .  clopidogrel (PLAVIX) 75 MG tablet, Take 1 tablet (75 mg total) by mouth daily., Disp: 30 tablet, Rfl: 11 .  doxycycline (VIBRAMYCIN) 100 MG capsule, Take 1 capsule (100 mg total) by mouth 2 (two) times daily., Disp: 20 capsule, Rfl: 0 .  lisinopril (PRINIVIL,ZESTRIL) 10 MG tablet, Take 10 mg by mouth daily. , Disp: , Rfl: 2 .  nicotine (NICODERM CQ - DOSED IN MG/24 HR) 7 mg/24hr patch, Place 7 mg onto the skin daily., Disp: , Rfl:  .  oxyCODONE-acetaminophen (PERCOCET/ROXICET) 5-325 MG tablet, Take 1 tablet by mouth every 4 (four) hours as needed for severe pain., Disp: 16  tablet, Rfl: 0 .  rosuvastatin (CRESTOR) 10 MG tablet, Take 10 mg by mouth daily., Disp: , Rfl:      PHYSICAL EXAM SECTION: His vital signs were stable in the emergency room at the time I saw him his pulse was stable and regular at 76 respiratory rates regular rate 18 he was afebrile   General appearance: Well-developed well-nourished no gross deformities mesomorphic body habitus   Eyes clear normal vision no evidence of conjunctivitis or jaundice, extraocular muscles intact   ENT: ears hearing normal, nasal passages clear, throat clear   Lymph nodes: No lymphadenopathy upper extremities   Neck is supple without palpable mass, full range of motion trachea midline  Cardiovascular normal pulse and perfusion with recent stent in his right leg, heart PMI in  normal position.  Neurologically deep tendon reflexes are equal and normal, no sensation loss or deficits no pathologic reflexes   Psychological: Awake alert and oriented x 3 mood and affect normal   Skin no lacerations or ulcerations no nodularity no palpable masses, no erythema or nodularity   Musculoskeletal:   The right upper extremity has no clubbing cyanosis or edema he has full range of motion without tenderness there is no instability motor function is normal   Left lower extremity no tenderness full range of motion no instability normal muscle tone and strength   Right lower extremity no contracture subluxation atrophy or tremor no malalignment   Left upper extremity left thumb is tender in the pulp space with fusiform swelling of the digit decreased flexion extension is noted at the IP joint no instability is noted there.  Muscle weakness is secondary to pain and swelling of the finger     MEDICAL DECISION SECTION:  Felon left thumb   Imaging X-rays negative  Personal interpretation: no fracture but soft tissue swelling    Plan:  (Rx., Inj., surg., Frx, MRI/CT, XR:2)   I/D LEFT THUMB    START PO ATBX KEEP CLEAN DRY    PROCEDURE: 02/16/2016   I/D LEFT THUMB PRE OP DX FELON LEFT THUMB POST OP SAME SURGEON: HARRISON ANESTHESIA local 1% lidocaine with epinephrine After site confirmation and timeout   The left thumb was prepped sterilely and injected with 15 cc of 1% lidocaine with epinephrine and digital block   After several minutes the patient was confirmed to be anesthetized and a midline incision was made at the point of maximal tenderness and fluctuance and a good 5 cc of purulent drainage came out this was debrided and irrigated and cultured   Sterile dressing was applied   No complications were noted      date 02-15-2018

## 2018-02-16 NOTE — Progress Notes (Unsigned)
Patrick Aguirre   Consult in ER   Requested by Dr Lady Saucier  02/15/2018  HISTORY SECTION :  Chief complaint patient has abscess and fell and left thumb  HPI The patient presents for evaluation of pain swelling left thumb.  Apparently 2 weeks ago the patient was laying brick hit his thumb few days later he noticed increased pain swelling and then 2 days ago noticed drainage from the finger with purulent drainage.  He says he cannot sleep at night it throbs pain is severe swelling can use it for his activities of daily living  Location left thumb Duration 2 weeks Quality dull throbbing Severity 10 out of 10 Associated with swelling and purulent drainage decreased range of motion   Review of Systems  Constitutional: Negative for fever.  Musculoskeletal: Positive for joint pain.  Skin: Negative.   All other systems reviewed and are negative.   Past Medical History:  Diagnosis Date  . Arthritis   . Chronic back pain   . Environmental allergies     Past Surgical History:  Procedure Laterality Date  . LOWER EXTREMITY ANGIOGRAPHY N/A 12/05/2017   Procedure: LOWER EXTREMITY ANGIOGRAPHY;  Surgeon: Yates Decamp, MD;  Location: [facility];  Service: Cardiovascular;  Laterality: N/A;  . LUMBAR LAMINECTOMY/DECOMPRESSION MICRODISCECTOMY N/A 07/20/2016   Procedure: Right L4-5 Microdiscectomy;  Surgeon: Eldred Manges, MD;  Location: [facility] OR;  Service: Orthopedics;  Laterality: N/A;  . NO PAST SURGERIES    . PERIPHERAL VASCULAR ATHERECTOMY  12/05/2017   Procedure: PERIPHERAL VASCULAR ATHERECTOMY;  Surgeon: Yates Decamp, MD;  [facility] CV LAB;  Service: Cardiovascular;;  . PERIPHERAL VASCULAR INTERVENTION  12/05/2017   Procedure: PERIPHERAL VASCULAR INTERVENTION;  Surgeon: Yates Decamp, MD;  Location: [facility];  Service: Cardiovascular;;    Social History   Tobacco Use  . Smoking status: Current Every Day Smoker    Packs/day: 0.50  . Smokeless tobacco: Never Used   Substance Use Topics  . Alcohol use: Yes    Comment: 2 beers daily  . Drug use: No    No family history on file. There is no family history on file but the patient denies any hypertension or diabetes in his family   No Known Allergies   Current Outpatient Medications:  .  aspirin EC 81 MG tablet, Take 81 mg by mouth daily., Disp: , Rfl:  .  cephALEXin (KEFLEX) 500 MG capsule, Take 1 capsule (500 mg total) by mouth 3 (three) times daily. (Patient not taking: Reported on 12/01/2017), Disp: 21 capsule, Rfl: 0 .  clopidogrel (PLAVIX) 75 MG tablet, Take 1 tablet (75 mg total) by mouth daily., Disp: 30 tablet, Rfl: 11 .  doxycycline (VIBRAMYCIN) 100 MG capsule, Take 1 capsule (100 mg total) by mouth 2 (two) times daily., Disp: 20 capsule, Rfl: 0 .  lisinopril (PRINIVIL,ZESTRIL) 10 MG tablet, Take 10 mg by mouth daily. , Disp: , Rfl: 2 .  nicotine (NICODERM CQ - DOSED IN MG/24 HR) 7 mg/24hr patch, Place 7 mg onto the skin daily., Disp: , Rfl:  .  oxyCODONE-acetaminophen (PERCOCET/ROXICET) 5-325 MG tablet, Take 1 tablet by mouth every 4 (four) hours as needed for severe pain., Disp: 16 tablet, Rfl: 0 .  rosuvastatin (CRESTOR) 10 MG tablet, Take 10 mg by mouth daily., Disp: , Rfl:    PHYSICAL EXAM SECTION: His vital signs were stable in the emergency room at the time I saw him his pulse was stable and regular at 76 respiratory rates regular  rate 18 he was afebrile  General appearance: Well-developed well-nourished no gross deformities mesomorphic body habitus  Eyes clear normal vision no evidence of conjunctivitis or jaundice, extraocular muscles intact  ENT: ears hearing normal, nasal passages clear, throat clear   Lymph nodes: No lymphadenopathy upper extremities  Neck is supple without palpable mass, full range of motion trachea midline  Cardiovascular normal pulse and perfusion with recent stent in his right leg, heart PMI in normal position.  Neurologically deep tendon reflexes  are equal and normal, no sensation loss or deficits no pathologic reflexes  Psychological: Awake alert and oriented x 3 mood and affect normal  Skin no lacerations or ulcerations no nodularity no palpable masses, no erythema or nodularity  Musculoskeletal:  The right upper extremity has no clubbing cyanosis or edema he has full range of motion without tenderness there is no instability motor function is normal  Left lower extremity no tenderness full range of motion no instability normal muscle tone and strength  Right lower extremity no contracture subluxation atrophy or tremor no malalignment  Left upper extremity left thumb is tender in the pulp space with fusiform swelling of the digit decreased flexion extension is noted at the IP joint no instability is noted there.  Muscle weakness is secondary to pain and swelling of the finger   MEDICAL DECISION SECTION:  Felon left thumb  Imaging X-rays negative  Personal interpretation: no fracture but soft tissue swelling   Plan:  (Rx., Inj., surg., Frx, MRI/CT, XR:2)  I/D LEFT THUMB   START PO ATBX KEEP CLEAN DRY   PROCEDURE: 02/16/2016  I/D LEFT THUMB PRE OP DX FELON LEFT THUMB POST OP SAME SURGEON: Cyla Haluska ANESTHESIA local 1% lidocaine with epinephrine After site confirmation and timeout  The left thumb was prepped sterilely and injected with 15 cc of 1% lidocaine with epinephrine and digital block  After several minutes the patient was confirmed to be anesthetized and a midline incision was made at the point of maximal tenderness and fluctuance and a good 5 cc of purulent drainage came out this was debrided and irrigated and cultured  Sterile dressing was applied  No complications were noted      8:01 AM

## 2018-02-18 LAB — AEROBIC CULTURE W GRAM STAIN (SUPERFICIAL SPECIMEN): Special Requests: NORMAL

## 2018-02-18 LAB — AEROBIC CULTURE  (SUPERFICIAL SPECIMEN)

## 2018-02-19 ENCOUNTER — Ambulatory Visit (INDEPENDENT_AMBULATORY_CARE_PROVIDER_SITE_OTHER): Payer: Medicaid Other | Admitting: Orthopedic Surgery

## 2018-02-19 ENCOUNTER — Telehealth (HOSPITAL_COMMUNITY): Payer: Self-pay | Admitting: Pharmacist

## 2018-02-19 ENCOUNTER — Encounter: Payer: Self-pay | Admitting: Orthopedic Surgery

## 2018-02-19 ENCOUNTER — Telehealth: Payer: Self-pay | Admitting: Emergency Medicine

## 2018-02-19 VITALS — BP 137/71 | HR 65 | Ht 71.0 in | Wt 209.0 lb

## 2018-02-19 DIAGNOSIS — L03019 Cellulitis of unspecified finger: Secondary | ICD-10-CM

## 2018-02-19 NOTE — Progress Notes (Signed)
Chief Complaint  Patient presents with  . thumb abcess    Left hand DOS 02/15/18    Status post incision drainage of felon in the emergency room this is post surgery day #4  His thumb looks much better still has some swelling he can move the tip of his finger pretty well  He is on oral antibiotic and have advised him to start soaking twice a day he will come back in a week    Current Outpatient Medications:  .  clopidogrel (PLAVIX) 75 MG tablet, Take 1 tablet (75 mg total) by mouth daily., Disp: 30 tablet, Rfl: 11 .  doxycycline (VIBRAMYCIN) 100 MG capsule, Take 1 capsule (100 mg total) by mouth 2 (two) times daily., Disp: 20 capsule, Rfl: 0 .  lisinopril (PRINIVIL,ZESTRIL) 10 MG tablet, Take 10 mg by mouth daily. , Disp: , Rfl: 2 .  oxyCODONE-acetaminophen (PERCOCET/ROXICET) 5-325 MG tablet, Take 1 tablet by mouth every 4 (four) hours as needed for severe pain., Disp: 16 tablet, Rfl: 0 .  rosuvastatin (CRESTOR) 10 MG tablet, Take 10 mg by mouth daily., Disp: , Rfl:  .  aspirin EC 81 MG tablet, Take 81 mg by mouth daily., Disp: , Rfl:  .  cephALEXin (KEFLEX) 500 MG capsule, Take 1 capsule (500 mg total) by mouth 3 (three) times daily. (Patient not taking: Reported on 12/01/2017), Disp: 21 capsule, Rfl: 0 .  nicotine (NICODERM CQ - DOSED IN MG/24 HR) 7 mg/24hr patch, Place 7 mg onto the skin daily., Disp: , Rfl:

## 2018-02-19 NOTE — Progress Notes (Signed)
ED Antimicrobial Stewardship Positive Culture Follow Up   Patrick Aguirre is an 57 y.o. male who presented to [facility] on (Not on file) with a chief complaint of No chief complaint on file.   Recent Results (from the past 720 hour(s))  Wound or Superficial Culture     Status: None   Collection Time: 02/15/18  4:45 PM  Result Value Ref Range Status   Specimen Description   Final    THUMB Performed at [facility], [address], [place], [place] [address]    Special Requests   Final    Normal Performed at [facility], [address] [place], [place] [address]    Gram Stain   Final    ABUNDANT WBC PRESENT, PREDOMINANTLY PMN ABUNDANT GRAM POSITIVE COCCI Performed at [facility], [address] [place], [place] [address]    Culture FEW PROTEUS SPECIES  Final   Report Status 02/18/2018 FINAL  Final   Organism ID, Bacteria PROTEUS SPECIES  Final      Susceptibility   Proteus species - MIC*    AMPICILLIN >=32 RESISTANT Resistant     CEFAZOLIN >=64 RESISTANT Resistant     CEFEPIME <=1 SENSITIVE Sensitive     CEFTAZIDIME <=1 SENSITIVE Sensitive     CEFTRIAXONE <=1 SENSITIVE Sensitive     CIPROFLOXACIN <=0.25 SENSITIVE Sensitive     GENTAMICIN <=1 SENSITIVE Sensitive     IMIPENEM 1 SENSITIVE Sensitive     TRIMETH/SULFA <=20 SENSITIVE Sensitive     AMPICILLIN/SULBACTAM 8 SENSITIVE Sensitive     PIP/TAZO <=4 SENSITIVE Sensitive     * FEW PROTEUS SPECIES    [x]  Treated with doxycycline, organism resistant to prescribed antimicrobial []  Patient discharged originally without antimicrobial agent and treatment is now indicated  New antibiotic prescription: Bactrim DS 1 tab po BID x 10 days  ED Provider: B. Ardelle Park   MastersDarl Householder 02/19/2018, 9:29 AM Clinical Pharmacist Monday - Friday phone -  [phone] Saturday - Sunday phone - [phone]

## 2018-02-19 NOTE — Telephone Encounter (Signed)
Post ED Visit - Positive Culture Follow-up: Successful Patient Follow-Up  Culture assessed and recommendations reviewed by:  []  Enzo Bi, Pharm.D. []  Celedonio Miyamoto, Pharm.D., BCPS AQ-ID []  Garvin Fila, Pharm.D., BCPS []  Georgina Pillion, Pharm.D., BCPS []  [place], [address].D., BCPS, AAHIVP []  Estella Husk, Pharm.D., BCPS, AAHIVP []  Lysle Pearl, PharmD, BCPS []  Phillips Climes, PharmD, BCPS [x]  Agapito Games, PharmD, BCPS []  Verlan Friends, PharmD  Positive thumb aerobic culture  []  Patient discharged without antimicrobial prescription and treatment is now indicated [x]  Organism is resistant to prescribed ED discharge antimicrobial []  Patient with positive blood cultures  Changes discussed with ED provider: Fayrene Helper, PA New antibiotic prescription Bactrim DS one tab PO BID x 10 days, stop doxycycline   Attempt to contact patient: no answer, no voicemail, letter sent.   [name] 02/19/2018, 3:34 PM

## 2018-02-19 NOTE — Patient Instructions (Signed)
Soak thumb for 15 minutes in warm water and stable salt 2 teaspoons  Do this twice a day  Continue oral antibiotics  Return 1 week

## 2018-02-20 ENCOUNTER — Other Ambulatory Visit: Payer: Self-pay | Admitting: Orthopedic Surgery

## 2018-02-20 ENCOUNTER — Telehealth: Payer: Self-pay | Admitting: Orthopedic Surgery

## 2018-02-20 DIAGNOSIS — L03019 Cellulitis of unspecified finger: Secondary | ICD-10-CM

## 2018-02-20 MED ORDER — HYDROCODONE-ACETAMINOPHEN 10-325 MG PO TABS: 1.0000 | ORAL_TABLET | ORAL | 0 refills | Status: DC | PRN

## 2018-02-20 NOTE — Telephone Encounter (Signed)
Patient is asking for pain medication. Stated Dr. Romeo Apple was going to send in a prescription yesterday for him.  PATIENT USES [facility]

## 2018-02-26 ENCOUNTER — Ambulatory Visit (INDEPENDENT_AMBULATORY_CARE_PROVIDER_SITE_OTHER): Payer: Medicaid Other | Admitting: Orthopedic Surgery

## 2018-02-26 ENCOUNTER — Encounter: Payer: Self-pay | Admitting: Orthopedic Surgery

## 2018-02-26 VITALS — BP 133/76 | HR 85 | Ht 71.0 in | Wt 210.0 lb

## 2018-02-26 DIAGNOSIS — L03019 Cellulitis of unspecified finger: Secondary | ICD-10-CM

## 2018-02-26 MED ORDER — CEPHALEXIN 500 MG PO CAPS: 500.0000 mg | ORAL_CAPSULE | Freq: Three times a day (TID) | ORAL | 0 refills | Status: DC

## 2018-02-26 NOTE — Progress Notes (Signed)
Chief Complaint  Patient presents with  . Hand Pain   57 year old male status post drainage of felon left thumb (02/15/18)  FU DAY # 11  He has a little tenderness and a little drainage.  I am going to have him continue to soak it.  He says he no longer needs any pain medication  I refilled his Keflex  Follow-up 1 week

## 2018-03-05 ENCOUNTER — Ambulatory Visit (INDEPENDENT_AMBULATORY_CARE_PROVIDER_SITE_OTHER): Payer: Medicaid Other | Admitting: Orthopedic Surgery

## 2018-03-05 ENCOUNTER — Encounter: Payer: Self-pay | Admitting: Orthopedic Surgery

## 2018-03-05 VITALS — BP 147/71 | HR 74 | Ht 71.0 in | Wt 210.0 lb

## 2018-03-05 DIAGNOSIS — L03019 Cellulitis of unspecified finger: Secondary | ICD-10-CM

## 2018-03-05 NOTE — Progress Notes (Signed)
Chief Complaint  Patient presents with  . Routine Post Op    I and D on 02/15/18 left thumb    Incision drainage felon currently on Keflex he did the soaks no drainage no tenderness no pain full range of motion at the IP joint no signs of infection follow-up as needed  Encounter Diagnosis  Name Primary?  Patrick Aguirre of finger s/p I and D 02/15/18 Yes

## 2018-03-05 NOTE — Patient Instructions (Signed)
Finish all your antibiotics

## 2018-07-17 ENCOUNTER — Other Ambulatory Visit: Payer: Self-pay | Admitting: Cardiology

## 2018-07-18 LAB — LIPID PANEL W/O CHOL/HDL RATIO
CHOLESTEROL TOTAL: 158 mg/dL (ref 100–199)
HDL: 83 mg/dL (ref >39)
LDL CALC: 65 mg/dL (ref 0–99)
Triglycerides: 48 mg/dL (ref 0–149)
VLDL CHOLESTEROL CAL: 10 mg/dL (ref 5–40)

## 2018-07-18 LAB — COMPREHENSIVE METABOLIC PANEL
ALBUMIN: 4.7 g/dL (ref 3.8–4.9)
ALT: 107 IU/L — AB (ref 0–44)
AST: 123 IU/L — ABNORMAL HIGH (ref 0–40)
Albumin/Globulin Ratio: 1.4 (ref 1.2–2.2)
Alkaline Phosphatase: 81 IU/L (ref 39–117)
BILIRUBIN TOTAL: 0.9 mg/dL (ref 0.0–1.2)
BUN/Creatinine Ratio: 11 (ref 9–20)
BUN: 11 mg/dL (ref 6–24)
CHLORIDE: 100 mmol/L (ref 96–106)
CO2: 18 mmol/L — AB (ref 20–29)
CREATININE: 1.02 mg/dL (ref 0.76–1.27)
Calcium: 10.1 mg/dL (ref 8.7–10.2)
GFR calc non Af Amer: 80 mL/min/{1.73_m2} (ref >59)
GFR, EST AFRICAN AMERICAN: 93 mL/min/{1.73_m2} (ref >59)
GLUCOSE: 91 mg/dL (ref 65–99)
Globulin, Total: 3.4 g/dL (ref 1.5–4.5)
Potassium: 5.6 mmol/L — ABNORMAL HIGH (ref 3.5–5.2)
Sodium: 136 mmol/L (ref 134–144)
Total Protein: 8.1 g/dL (ref 6.0–8.5)

## 2018-07-18 LAB — CBC WITH DIFFERENTIAL/PLATELET
BASOS ABS: 0 10*3/uL (ref 0.0–0.2)
Basos: 1 %
EOS (ABSOLUTE): 0.1 10*3/uL (ref 0.0–0.4)
Eos: 2 %
HEMOGLOBIN: 12.4 g/dL — AB (ref 13.0–17.7)
Hematocrit: 36.4 % — ABNORMAL LOW (ref 37.5–51.0)
IMMATURE GRANS (ABS): 0 10*3/uL (ref 0.0–0.1)
IMMATURE GRANULOCYTES: 0 %
LYMPHS: 42 %
Lymphocytes Absolute: 2.7 10*3/uL (ref 0.7–3.1)
MCH: 30.6 pg (ref 26.6–33.0)
MCHC: 34.1 g/dL (ref 31.5–35.7)
MCV: 90 fL (ref 79–97)
MONOCYTES: 9 %
Monocytes Absolute: 0.6 10*3/uL (ref 0.1–0.9)
NEUTROS ABS: 3 10*3/uL (ref 1.4–7.0)
NEUTROS PCT: 46 %
PLATELETS: 254 10*3/uL (ref 150–450)
RBC: 4.05 x10E6/uL — AB (ref 4.14–5.80)
RDW: 12.2 % (ref 11.6–15.4)
WBC: 6.5 10*3/uL (ref 3.4–10.8)

## 2018-07-24 NOTE — Progress Notes (Signed)
He should

## 2018-07-24 NOTE — Progress Notes (Signed)
No

## 2018-07-26 ENCOUNTER — Telehealth: Payer: Self-pay

## 2018-08-03 NOTE — Progress Notes (Signed)
Subjective:   Patrick Aguirre, male    DOB: 1960-11-07, 58 y.o.   MRN: [id number]  Patient, No Pcp Per:  Chief Complaint  Patient presents with  . Follow-up    lab results  . PVD    HPI: Patrick Aguirre  is a 58 y.o. male  with only known PMH for chronic tobacco use and chronic back pain s/p L4-5 microdisectomy in Feb 2018. Due to PAD, he underwent successful revascularization of the right common iliac artery with implantation of colored stent on 12/05/2017, since then has noticed significant improvement in claudication.  Patient continues report bilateral leg pain with walking long distances or standing for long periods of time, but feels that this has improved prior to his intervention. Reports he is able to walk longer distances. No chest pain or shortness of breath.   He recently underwent labs that revealed elevated liver enzymes and potassium level. I had asked patient to come in to discuss results. He has not had any recent medication changes.Admits to eating fried potatoes, beans, packaged lunch meat, hot dogs, frequently.   He reports smoking 8 cigarettes per day which is down from half a pack per day previously. He has also cut back on alcohol use to not drinking alcohol daily. Does drink 1-2 beers some days.   Past Medical History:  Diagnosis Date  . Arthritis   . Chronic back pain   . Environmental allergies     Past Surgical History:  Procedure Laterality Date  . LOWER EXTREMITY ANGIOGRAPHY N/A 12/05/2017   Procedure: LOWER EXTREMITY ANGIOGRAPHY;  Surgeon: Adrian Prows, MD;  Location: [facility];  Service: Cardiovascular;  Laterality: N/A;  . LUMBAR LAMINECTOMY/DECOMPRESSION MICRODISCECTOMY N/A 07/20/2016   Procedure: Right L4-5 Microdiscectomy;  Surgeon: Marybelle Killings, MD;  Location: [place];  Service: Orthopedics;  Laterality: N/A;  . NO PAST SURGERIES    . PERIPHERAL VASCULAR ATHERECTOMY  12/05/2017   Procedure: PERIPHERAL VASCULAR ATHERECTOMY;  Surgeon:  Adrian Prows, MD;  Location: [facility];  Service: Cardiovascular;;  . PERIPHERAL VASCULAR INTERVENTION  12/05/2017   Procedure: PERIPHERAL VASCULAR INTERVENTION;  Surgeon: Adrian Prows, MD;  Location: [facility];  Service: Cardiovascular;;    Family History  Problem Relation Age of Onset  . Diabetes Mother   . Hypertension Mother   . Heart disease Sister     Social History   Socioeconomic History  . Marital status: Married    Spouse name: Not on file  . Number of children: 1  . Years of education: Not on file  . Highest education level: Not on file  Occupational History  . Not on file  Social Needs  . Financial resource strain: Not on file  . Food insecurity:    Worry: Not on file    Inability: Not on file  . Transportation needs:    Medical: Not on file    Non-medical: Not on file  Tobacco Use  . Smoking status: Current Every Day Smoker    Packs/day: 0.50  . Smokeless tobacco: Never Used  Substance and Sexual Activity  . Alcohol use: Yes    Comment: 2 beers daily  . Drug use: No  . Sexual activity: Not on file  Lifestyle  . Physical activity:    Days per week: Not on file    Minutes per session: Not on file  . Stress: Not on file  Relationships  . Social connections:    Talks on phone:  Not on file    Gets together: Not on file    Attends religious service: Not on file    Active member of club or organization: Not on file    Attends meetings of clubs or organizations: Not on file    Relationship status: Not on file  . Intimate partner violence:    Fear of current or ex partner: Not on file    Emotionally abused: Not on file    Physically abused: Not on file    Forced sexual activity: Not on file  Other Topics Concern  . Not on file  Social History Narrative  . Not on file    Current Meds  Medication Sig  . aspirin EC 81 MG tablet Take 81 mg by mouth daily.  . clopidogrel (PLAVIX) 75 MG tablet Take 1 tablet (75 mg total) by mouth daily.  Marland Kitchen  lisinopril (PRINIVIL,ZESTRIL) 10 MG tablet Take 10 mg by mouth daily.   . rosuvastatin (CRESTOR) 10 MG tablet Take 10 mg by mouth daily.     Review of Systems  Constitution: Negative for decreased appetite, malaise/fatigue, weight gain and weight loss.  Eyes: Negative for visual disturbance.  Cardiovascular: Positive for claudication (bilateral calfs; improved from before. Negative for chest pain, dyspnea on exertion, leg swelling, orthopnea, palpitations and syncope.  Respiratory: Negative for hemoptysis and wheezing.   Endocrine: Negative for cold intolerance and heat intolerance.  Hematologic/Lymphatic: Does not bruise/bleed easily.  Skin: Negative for nail changes.  Musculoskeletal: Negative for muscle weakness and myalgias.  Gastrointestinal: Negative for abdominal pain, change in bowel habit, nausea and vomiting.  Neurological: Negative for difficulty with concentration, dizziness, focal weakness and headaches.  Psychiatric/Behavioral: Negative for altered mental status and suicidal ideas.  All other systems reviewed and are negative.      Objective:     Blood pressure (!) 144/70, pulse 65, height _0  (1.803 m), weight 207 lb 1.6 oz (93.9 kg), SpO2 99 %.  Cardiac studies:  EKG 03/21/2018: Sinus rhythm 79 bpm. Normal axis. Borderline first-degree AV block. Old anteroseptal infarct.  Carotid artery duplex 04/09/2018: No hemodynamically significant arterial disease in the internal carotid artery bilaterally. Very mild heterogeneous plaque noted bilaterally. Antegrade right vertebral artery flow. Antegrade left vertebral artery flow.  Abdominal aortic duplex 03/06/2018: No AAA observed. The maximum aorta diameter is 2.45 cm (prox). No evidence of atherosclerotic plaque. Normal flow velocities noted in the aorta and iliac vessels. There is diffuse calcific plaque. Right iliac artery stent appears patent.  ABI 01/02/2018: This exam reveals mildly decreased perfusion of the  bilateral lower extremity, RABI 0.91 and LABI 0.88. Mild plaque evident in lower extremity. However triphasic waveform (Normal) noted. Clinical correlation recommended. Compared to 11/21/2017, right ABI has improved from 0.52.  Peripheral arteriogram 12/05/2017: CSI atherectomy followed by stenting of right CIA with 10.0 x 39 mm Viabhan VBX, 100% to 0%. Left CIA 50% stenosis without gradient.  US abdomen 10/31/2017: 1. Diffuse Fatty infiltration of the liver. 2. No acute or suspicious findings.  Lexiscan myoview stress test 09/18/2017: 1. The resting electrocardiogram demonstrated normal sinus rhythm, poor R progression, normal resting conduction, no resting arrhythmias and non-specific T changes. Stress EKG is non-diagnostic for ischemia as it a pharmacologic stress using Lexiscan. The patient developed significant symptoms which included Flushing. 2. LV is dilated both at rest and stress images. The LV end diastolic volume was 315 mL. Stress and rest SPECT images demonstrate homogeneous tracer distribution throughout the myocardium. Gated SPECT imaging reveals normal  myocardial thickening and wall motion. The left ventricular ejection fraction was low normal (53%). This is a low risk study.  EKG 08/30/2017: Sinus rhythm at rate of 83 bpm, normal axis. No evidence of ischemia, normal EKG.   Recent Labs:    Lipid Panel     Component Value Date/Time   CHOL 158 07/17/2018 1026   TRIG 48 07/17/2018 1026   HDL 83 07/17/2018 1026   LDLCALC 65 07/17/2018 1026   Hepatic Function Panel     Component Value Date/Time   PROT 8.1 07/17/2018 1026   ALBUMIN 4.7 07/17/2018 1026   AST 123 (H) 07/17/2018 1026   ALT 107 (H) 07/17/2018 1026   ALKPHOS 81 07/17/2018 1026   BILITOT 0.9 07/17/2018 1026   BMP Latest Ref Rng & Units 07/17/2018  Glucose 65 - 99 mg/dL 91  BUN 6 - 24 mg/dL 11  Creatinine 0.76 - 1.27 mg/dL 1.02  BUN/Creat Ratio 9 - 20 11  Sodium 134 - 144 mmol/L 136  Potassium 3.5 - 5.2  mmol/L 5.6(H)  Chloride 96 - 106 mmol/L 100  CO2 20 - 29 mmol/L 18(L)  Calcium 8.7 - 10.2 mg/dL 10.1  eGFR Af Amer  >66m/min 93   CBC Latest Ref Rng & Units 07/17/2018  WBC 3.4 - 10.8 x10E3/uL 6.5  Hemoglobin 13.0 - 17.7 g/dL 12.4(L)  Hematocrit 37.5 - 51.0 % 36.4(L)  Platelets 150 - 450 x10E3/uL 254    09/08/2017: Total cholesterol 175, triglycerides 43, HDL 84, LDL 82. TSH normal.    Physical Exam  Constitutional: He appears well-developed and well-nourished. No distress.  Mildly Obese  HENT:  Head: Atraumatic.  Eyes: Conjunctivae are normal.  Neck: Neck supple. No JVD present. No thyromegaly present.  Cardiovascular: Normal rate, regular rhythm, normal heart sounds and intact distal pulses. Exam reveals no gallop.  No murmur heard. Pulses:      Carotid pulses are on the right side with bruit and on the left side with bruit. Pulmonary/Chest: Effort normal and breath sounds normal.  Abdominal: Soft. Bowel sounds are normal.  Musculoskeletal: Normal range of motion.        General: No edema.  Neurological: He is alert.  Skin: Skin is warm and dry.  Psychiatric: He has a normal mood and affect.        Assessment & Recommendations:  1. Elevated liver enzymes Newly noted without making any medication changes. Will hold Crestor for 1 month and repeat CMP in 1 month for follow up. Advised that he should be abstinent from alcohol use.   2. Hyperkalemia Suspect diet related. Advised him to avoid foods high in potassium. Will decrease Lisinopril to 5 mg daily.  3. Essential hypertension Will add amlodipine 536mdaily in view of decreasing dose of Lisinopril for improved BP control.   4. PAD (peripheral artery disease) (HCC) Claudication symptoms remain stable since intervention. Continue with ASA and plavix.   5. Tobacco use disorder He has been able to cut back from previous amount of half a pack per day. Stressed the importance of continued efforts toward complete smoking  cessation. Will reevaluate at next office visit.  Plan: I will see him back in 6 weeks after repeat labs for follow up.   AsJeri LagerMSN, APRN, FNP-C [facility], PAArendtsvilleffice: [phone]: [phone]

## 2018-08-07 ENCOUNTER — Other Ambulatory Visit: Payer: Self-pay

## 2018-08-07 ENCOUNTER — Ambulatory Visit (INDEPENDENT_AMBULATORY_CARE_PROVIDER_SITE_OTHER): Payer: Medicaid Other | Admitting: Cardiology

## 2018-08-07 ENCOUNTER — Encounter: Payer: Self-pay | Admitting: Cardiology

## 2018-08-07 VITALS — BP 144/70 | HR 65 | Ht 71.0 in | Wt 207.1 lb

## 2018-08-07 DIAGNOSIS — E875 Hyperkalemia: Secondary | ICD-10-CM | POA: Insufficient documentation

## 2018-08-07 DIAGNOSIS — R748 Abnormal levels of other serum enzymes: Secondary | ICD-10-CM

## 2018-08-07 DIAGNOSIS — I739 Peripheral vascular disease, unspecified: Secondary | ICD-10-CM | POA: Diagnosis not present

## 2018-08-07 DIAGNOSIS — F17200 Nicotine dependence, unspecified, uncomplicated: Secondary | ICD-10-CM

## 2018-08-07 DIAGNOSIS — I10 Essential (primary) hypertension: Secondary | ICD-10-CM

## 2018-08-07 MED ORDER — LISINOPRIL 5 MG PO TABS: 5.0000 mg | ORAL_TABLET | Freq: Every day | ORAL | 3 refills | Status: DC

## 2018-08-07 MED ORDER — AMLODIPINE BESYLATE 5 MG PO TABS: 5.0000 mg | ORAL_TABLET | Freq: Every day | ORAL | 3 refills | Status: DC

## 2018-08-07 NOTE — Patient Instructions (Signed)
Stop Crestor for elevated liver enzymes  Cut dose of lisinopril in half for elevated potassium levels  Watch potassium in diet  Start amlodipine 5mg  daily for blood pressure  Continue with Aspirin and Plavix  Continue to work to quit smoking  Blood work in 1 month

## 2018-09-10 DIAGNOSIS — F1010 Alcohol abuse, uncomplicated: Secondary | ICD-10-CM | POA: Insufficient documentation

## 2018-09-10 NOTE — Progress Notes (Deleted)
Primary Physician/Referring:  Patient, No Pcp Per  Patient ID: Patrick Aguirre, male    DOB: 11/05/1960, 58 y.o.   MRN: [id number]  No chief complaint on file.   HPI: Patrick Aguirre  is a 58 y.o. male  with chronic tobacco use and chronic back pain s/p L4-5 microdisectomy in Feb 2018, PAD with right common iliac artery with implantation of covered stent on 12/05/2017 presents for f/u of abnormal LFT and PAD. Seen 6 weeks ago in clinic.  Symptoms of claudication remained stable.  He has reduced smoking but reports smoking 8 cigarettes per day which is down from half a pack per day previously. He has also cut back on alcohol use to not drinking alcohol daily. Does drink 1-2 beers some days. He was noted to have elevated LFTs in the past, repeat labs also revealed persistent elevation in LFTs. Crestor was discontinued on his last office visit and labs repeated.  He now presents for follow-up.  Past Medical History:  Diagnosis Date  . Arthritis   . Chronic back pain   . Environmental allergies     Past Surgical History:  Procedure Laterality Date  . LOWER EXTREMITY ANGIOGRAPHY N/A 12/05/2017   Procedure: LOWER EXTREMITY ANGIOGRAPHY;  Surgeon: Yates Decamp, MD;  Location: [facility];  Service: Cardiovascular;  Laterality: N/A;  . LUMBAR LAMINECTOMY/DECOMPRESSION MICRODISCECTOMY N/A 07/20/2016   Procedure: Right L4-5 Microdiscectomy;  Surgeon: Eldred Manges, MD;  Location: [facility];  Service: Orthopedics;  Laterality: N/A;  . NO PAST SURGERIES    . PERIPHERAL VASCULAR ATHERECTOMY  12/05/2017   Procedure: PERIPHERAL VASCULAR ATHERECTOMY;  Surgeon: Yates Decamp, MD;  Location: [facility];  Service: Cardiovascular;;  . PERIPHERAL VASCULAR INTERVENTION  12/05/2017   Procedure: PERIPHERAL VASCULAR INTERVENTION;  Surgeon: Yates Decamp, MD;  Location: [facility];  Service: Cardiovascular;;    Social History   Socioeconomic History  . Marital status: Married    Spouse name: Not on file   . Number of children: 1  . Years of education: Not on file  . Highest education level: Not on file  Occupational History  . Not on file  Social Needs  . Financial resource strain: Not on file  . Food insecurity:    Worry: Not on file    Inability: Not on file  . Transportation needs:    Medical: Not on file    Non-medical: Not on file  Tobacco Use  . Smoking status: Current Every Day Smoker    Packs/day: 0.50  . Smokeless tobacco: Never Used  Substance and Sexual Activity  . Alcohol use: Yes    Comment: 2 beers daily  . Drug use: No  . Sexual activity: Not on file  Lifestyle  . Physical activity:    Days per week: Not on file    Minutes per session: Not on file  . Stress: Not on file  Relationships  . Social connections:    Talks on phone: Not on file    Gets together: Not on file    Attends religious service: Not on file    Active member of club or organization: Not on file    Attends meetings of clubs or organizations: Not on file    Relationship status: Not on file  . Intimate partner violence:    Fear of current or ex partner: Not on file    Emotionally abused: Not on file    Physically abused: Not on file    Forced  sexual activity: Not on file  Other Topics Concern  . Not on file  Social History Narrative  . Not on file    Current Outpatient Medications on File Prior to Visit  Medication Sig Dispense Refill  . amLODipine (NORVASC) 5 MG tablet Take 1 tablet (5 mg total) by mouth daily. 180 tablet 3  . aspirin EC 81 MG tablet Take 81 mg by mouth daily.    . cephALEXin (KEFLEX) 500 MG capsule Take 1 capsule (500 mg total) by mouth 3 (three) times daily. (Patient not taking: Reported on 08/07/2018) 21 capsule 0  . clopidogrel (PLAVIX) 75 MG tablet Take 1 tablet (75 mg total) by mouth daily. 30 tablet 11  . lisinopril (PRINIVIL,ZESTRIL) 5 MG tablet Take 1 tablet (5 mg total) by mouth daily. 90 tablet 3  . nicotine (NICODERM CQ - DOSED IN MG/24 HR) 7 mg/24hr patch  Place 7 mg onto the skin daily.    Marland Kitchen. oxyCODONE-acetaminophen (PERCOCET/ROXICET) 5-325 MG tablet Take 1 tablet by mouth every 4 (four) hours as needed for severe pain. (Patient not taking: Reported on 08/07/2018) 16 tablet 0   No current facility-administered medications on file prior to visit.     Review of Systems  Constitution: Negative for chills, decreased appetite, malaise/fatigue and weight gain.  Cardiovascular: Positive for claudication (stable). Negative for dyspnea on exertion, leg swelling and syncope.  Endocrine: Negative for cold intolerance.  Hematologic/Lymphatic: Does not bruise/bleed easily.  Musculoskeletal: Negative for joint swelling.  Gastrointestinal: Negative for abdominal pain, anorexia and change in bowel habit.  Neurological: Negative for headaches and light-headedness.  Psychiatric/Behavioral: Negative for depression and substance abuse.  All other systems reviewed and are negative.     Objective:  There were no vitals taken for this visit. There is no height or weight on file to calculate BMI.  ***Physical Exam  Constitutional: He appears well-developed and well-nourished. No distress.  Appears older than stated age  HENT:  Head: Atraumatic.  Eyes: Conjunctivae are normal.  Neck: Neck supple. No JVD present. No thyromegaly present.  Cardiovascular: Normal rate, regular rhythm, normal heart sounds and intact distal pulses. Exam reveals no gallop.  No murmur heard. Pulses:      Carotid pulses are 2+ on the right side and 2+ on the left side with bruit.      Radial pulses are 2+ on the right side and 2+ on the left side.       Femoral pulses are 2+ on the right side and 2+ on the left side.      Popliteal pulses are 2+ on the right side and 2+ on the left side.       Dorsalis pedis pulses are 2+ on the right side and 2+ on the left side.       Posterior tibial pulses are 1+ on the right side and 1+ on the left side.  Thick rigid nails.   Pulmonary/Chest:  Effort normal and breath sounds normal.  Abdominal: Soft. Bowel sounds are normal.  Musculoskeletal: Normal range of motion.        General: No edema.  Neurological: He is alert.  Skin: Skin is warm and dry.  Psychiatric: He has a normal mood and affect.   Radiology: No results found. Laboratory Examination:    CMP Latest Ref Rng & Units 07/17/2018 07/19/2016  Glucose 65 - 99 mg/dL 91 97  BUN 6 - 24 mg/dL 11 8  Creatinine 0.980.76 - 1.27 mg/dL 1.191.02 1.471.09  Sodium 829134 -  144 mmol/L 136 140  Potassium 3.5 - 5.2 mmol/L 5.6(H) 4.2  Chloride 96 - 106 mmol/L 100 102  CO2 20 - 29 mmol/L 18(L) 26  Calcium 8.7 - 10.2 mg/dL 16.1 9.8  Total Protein 6.0 - 8.5 g/dL 8.1 9.0(H)  Total Bilirubin 0.0 - 1.2 mg/dL 0.9 0.9(U)  Alkaline Phos 39 - 117 IU/L 81 60  AST 0 - 40 IU/L 123(H) 88(H)  ALT 0 - 44 IU/L 107(H) 55   CBC Latest Ref Rng & Units 07/17/2018 07/19/2016  WBC 3.4 - 10.8 x10E3/uL 6.5 7.8  Hemoglobin 13.0 - 17.7 g/dL 12.4(L) 14.8  Hematocrit 37.5 - 51.0 % 36.4(L) 42.7  Platelets 150 - 450 x10E3/uL 254 261   Lipid Panel     Component Value Date/Time   CHOL 158 07/17/2018 1026   TRIG 48 07/17/2018 1026   HDL 83 07/17/2018 1026   LDLCALC 65 07/17/2018 1026   HEMOGLOBIN A1C No results found for: HGBA1C, MPG TSH No results for input(s): TSH in the last 8760 hours.  Cardiac studies:   EKG 03/21/2018: Sinus rhythm 79 bpm. Normal axis. Borderline first-degree AV block. Old anteroseptal infarct.  Carotid artery duplex 04/09/2018: No hemodynamically significant arterial disease in the internal carotid artery bilaterally. Very mild heterogeneous plaque noted bilaterally. Antegrade right vertebral artery flow. Antegrade left vertebral artery flow.  Abdominal aortic duplex 03/06/2018: No AAA observed. The maximum aorta diameter is 2.45 cm (prox). No evidence of atherosclerotic plaque. Normal flow velocities noted in the aorta and iliac vessels. There is diffuse calcific plaque. Right iliac artery  stent appears patent.  ABI 01/02/2018: This exam reveals mildly decreased perfusion of the bilateral lower extremity, RABI 0.91 and LABI 0.88. Mild plaque evident in lower extremity. However triphasic waveform (Normal) noted. Clinical correlation recommended. Compared to 11/21/2017, right ABI has improved from 0.52.  Peripheral arteriogram 12/05/2017: CSI atherectomy followed by stenting of right CIA with 10.0 x 39 mm Viabhan VBX, 100% to 0%. Left CIA 50% stenosis without gradient.  US abdomen 10/31/2017: 1. Diffuse Fatty infiltration of the liver. 2. No acute or suspicious findings.  Lexiscan myoview stress test 09/18/2017: 1. The resting electrocardiogram demonstrated normal sinus rhythm, poor R progression, normal resting conduction, no resting arrhythmias and non-specific T changes. Stress EKG is non-diagnostic for ischemia as it a pharmacologic stress using Lexiscan. The patient developed significant symptoms which included Flushing. 2. LV is dilated both at rest and stress images. The LV end diastolic volume was 148 mL. Stress and rest SPECT images demonstrate homogeneous tracer distribution throughout the myocardium. Gated SPECT imaging reveals normal myocardial thickening and wall motion. The left ventricular ejection fraction was low normal (53%). This is a low risk study.  Assessment:    Elevated liver enzymes  Claudication in peripheral vascular disease (HCC)  Tobacco use disorder  Alcohol abuse   EKG 08/30/2017: Sinus rhythm at rate of 83 bpm, normal axis. No evidence of ischemia, normal EKG.  Recommendations:    I have reviewed the results of the recently performed labs, in spite of discontinuing statins, LFTs are elevated suggesting hepatotoxicity from alcohol.  I strongly discussed with him regarding potential development for cirrhosis of the liver as well.  He does have significant vascular risk factors and does need statin therapy as well, he is only 57 years of age but  looks much older and ill.  Also continues to smoke and smoking cessation again discussed.  As his LFTs have been stable in spite of being on statin, recommended that  he continue Crestor 10 mg daily.  We will repeat his CMP in 3 months and see him back at that time.  We could consider ultrasound of the abdomen to evaluate for any cirrhotics changes.  Yates Decamp, MD, [facility] 09/10/2018, 9:09 PM [facility]. [place] Pager: [phone] Office: [phone] If no answer Cell [phone]

## 2018-09-11 ENCOUNTER — Ambulatory Visit: Payer: Self-pay | Admitting: Cardiology

## 2018-09-18 ENCOUNTER — Ambulatory Visit: Payer: Medicaid Other | Admitting: Cardiology

## 2018-09-18 LAB — COMPREHENSIVE METABOLIC PANEL
ALT: 59 IU/L — ABNORMAL HIGH (ref 0–44)
AST: 64 IU/L — ABNORMAL HIGH (ref 0–40)
Albumin/Globulin Ratio: 1.4 (ref 1.2–2.2)
Albumin: 4.3 g/dL (ref 3.8–4.9)
Alkaline Phosphatase: 64 IU/L (ref 39–117)
BUN/Creatinine Ratio: 9 — ABNORMAL LOW (ref 10–24)
BUN: 7 mg/dL — ABNORMAL LOW (ref 8–27)
Bilirubin Total: 0.2 mg/dL (ref 0.0–1.2)
CO2: 20 mmol/L (ref 20–29)
Calcium: 9 mg/dL (ref 8.6–10.2)
Chloride: 104 mmol/L (ref 96–106)
Creatinine, Ser: 0.79 mg/dL (ref 0.76–1.27)
GFR calc Af Amer: 113 mL/min/{1.73_m2} (ref >59)
GFR calc non Af Amer: 98 mL/min/{1.73_m2} (ref >59)
Globulin, Total: 3 g/dL (ref 1.5–4.5)
Glucose: 83 mg/dL (ref 65–99)
Potassium: 5 mmol/L (ref 3.5–5.2)
SODIUM: 137 mmol/L (ref 134–144)
Total Protein: 7.3 g/dL (ref 6.0–8.5)

## 2018-09-25 ENCOUNTER — Encounter: Payer: Self-pay | Admitting: Cardiology

## 2018-09-25 ENCOUNTER — Other Ambulatory Visit: Payer: Self-pay

## 2018-09-25 ENCOUNTER — Ambulatory Visit: Payer: Medicaid Other | Admitting: Cardiology

## 2018-09-25 DIAGNOSIS — I739 Peripheral vascular disease, unspecified: Secondary | ICD-10-CM

## 2018-09-25 DIAGNOSIS — F17200 Nicotine dependence, unspecified, uncomplicated: Secondary | ICD-10-CM

## 2018-09-25 DIAGNOSIS — Z7289 Other problems related to lifestyle: Secondary | ICD-10-CM

## 2018-09-25 DIAGNOSIS — R748 Abnormal levels of other serum enzymes: Secondary | ICD-10-CM

## 2018-09-25 DIAGNOSIS — I10 Essential (primary) hypertension: Secondary | ICD-10-CM

## 2018-09-25 DIAGNOSIS — Z789 Other specified health status: Secondary | ICD-10-CM

## 2018-09-25 MED ORDER — ROSUVASTATIN CALCIUM 5 MG PO TABS: 5.0000 mg | ORAL_TABLET | Freq: Every day | ORAL | 3 refills | Status: DC

## 2018-09-25 MED ORDER — VARENICLINE TARTRATE 0.5 MG PO TABS: 0.5000 mg | ORAL_TABLET | Freq: Two times a day (BID) | ORAL | 1 refills | Status: DC

## 2018-09-25 NOTE — Progress Notes (Signed)
Primary Physician/Referring:  Patient, No Pcp Per  Patient ID: Patrick Aguirre, male    DOB: [birth year], 58 y.o.   MRN: [id number]  Chief Complaint  Patient presents with  . PAD  . Follow-up   This visit type was conducted due to national recommendations for restrictions regarding the COVID-19 Pandemic (e.g. social distancing).  This format is felt to be most appropriate for this patient at this time.  All issues noted in this document were discussed and addressed.  No physical exam was performed (except for noted visual exam findings with Telehealth visits).  The patient has consented to conduct a Telehealth visit and understands insurance will be billed.   I discussed the limitations of evaluation and management by telemedicine and the availability of in person appointments. The patient expressed understanding and agreed to proceed.  Virtual Visit via Video Note is as below  I connected with Patrick Aguirre, on 09/25/18 at 1130 by telephone and verified that I am speaking with the correct person using two identifiers. Patient unable to perform video visit due to patient not having the equipment.    I have discussed with her regarding the safety during COVID Pandemic and steps and precautions including social distancing with the patient.    HPI: Patrick Aguirre  is a 58 y.o. male  With chronic tobacco use and chronic back pain s/p L4-5 microdisectomy in Feb 2018. Due to PAD, he underwent successful revascularization of the right common iliac artery with implantation of colored stent on 12/05/2017, since then has noticed significant improvement in claudication.  Patient continues report bilateral leg pain with walking long distances or standing for long periods of time, but feels that this has improved prior to his intervention. Reports he is able to walk longer distances. No chest pain or shortness of breath. 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 Ophelia CharterYates. He believes he pulled something doing this.  Recent labs had revealed elevated liver enzymes and hyperkalemia. Crestor was held for possible etiology. Lisinopril dose was decreased to half and started on amlodipine 5 mg for hypertension. He now presents to discuss recent labs.   He reports smoking half a pack per day. Interested in chantix to help him quit. Continues to drink 1-2 beers some days.   Past Medical History:  Diagnosis Date  . Arthritis   . Chronic back pain   . Environmental allergies     Past Surgical History:  Procedure Laterality Date  . LOWER EXTREMITY ANGIOGRAPHY N/A 12/05/2017   Procedure: LOWER EXTREMITY ANGIOGRAPHY;  Surgeon: Yates DecampGanji, Jay, MD;  Location: [facility];  Service: Cardiovascular;  Laterality: N/A;  . LUMBAR LAMINECTOMY/DECOMPRESSION MICRODISCECTOMY N/A 07/20/2016   Procedure: Right L4-5 Microdiscectomy;  Surgeon: Eldred MangesMark C Yates, MD;  Location: [facility];  Service: Orthopedics;  Laterality: N/A;  . NO PAST SURGERIES    . PERIPHERAL VASCULAR ATHERECTOMY  12/05/2017   Procedure: PERIPHERAL VASCULAR ATHERECTOMY;  Surgeon: Yates DecampGanji, Jay, MD;  [facility];  Service: Cardiovascular;;  . PERIPHERAL VASCULAR INTERVENTION  12/05/2017   Procedure: PERIPHERAL VASCULAR INTERVENTION;  Surgeon: Yates DecampGanji, Jay, MD;  Location: [facility];  Service: Cardiovascular;;    Social History   Socioeconomic History  . Marital status: Married    Spouse name: Not on file  . Number of children: 1  . Years of education: Not on file  .  Highest education level: Not on file  Occupational History  . Not on file  Social Needs  . Financial resource strain: Not on file  . Food insecurity:    Worry: Not on file    Inability: Not on file  . Transportation needs:    Medical: Not on file    Non-medical: Not on file  Tobacco Use  . Smoking status: Current Every Day Smoker    Packs/day: 0.50  .  Smokeless tobacco: Never Used  Substance and Sexual Activity  . Alcohol use: Yes    Comment: 2 beers daily  . Drug use: No  . Sexual activity: Not on file  Lifestyle  . Physical activity:    Days per week: Not on file    Minutes per session: Not on file  . Stress: Not on file  Relationships  . Social connections:    Talks on phone: Not on file    Gets together: Not on file    Attends religious service: Not on file    Active member of club or organization: Not on file    Attends meetings of clubs or organizations: Not on file    Relationship status: Not on file  . Intimate partner violence:    Fear of current or ex partner: Not on file    Emotionally abused: Not on file    Physically abused: Not on file    Forced sexual activity: Not on file  Other Topics Concern  . Not on file  Social History Narrative  . Not on file    Current Outpatient Medications on File Prior to Visit  Medication Sig Dispense Refill  . amLODipine (NORVASC) 5 MG tablet Take 1 tablet (5 mg total) by mouth daily. 180 tablet 3  . aspirin EC 81 MG tablet Take 81 mg by mouth daily.    . clopidogrel (PLAVIX) 75 MG tablet Take 1 tablet (75 mg total) by mouth daily. 30 tablet 11  . lisinopril (PRINIVIL,ZESTRIL) 5 MG tablet Take 1 tablet (5 mg total) by mouth daily. (Patient taking differently: Take 5 mg by mouth daily. 1/2 tab daily) 90 tablet 3  . loratadine (CLARITIN) 10 MG tablet Take 10 mg by mouth daily.     No current facility-administered medications on file prior to visit.     Review of Systems  Constitution: Negative for decreased appetite, malaise/fatigue, weight gain and weight loss.  Eyes: Negative for visual disturbance.  Cardiovascular: Positive for claudication (bilateral calves; improved from before). Negative for chest pain, dyspnea on exertion, leg swelling, orthopnea, palpitations and syncope.  Respiratory: Negative for hemoptysis and wheezing.   Endocrine: Negative for cold intolerance  and heat intolerance.  Hematologic/Lymphatic: Does not bruise/bleed easily.  Skin: Negative for nail changes.  Musculoskeletal: Positive for back pain. Negative for muscle weakness and myalgias.  Gastrointestinal: Negative for abdominal pain, change in bowel habit, nausea and vomiting.  Neurological: Negative for difficulty with concentration, dizziness, focal weakness and headaches.  Psychiatric/Behavioral: Negative for altered mental status and suicidal ideas.  All other systems reviewed and are negative.     Objective  There were no vitals taken for this visit. There is no height or weight on file to calculate BMI.  Physical exam not performed or limited due to virtual visit. Please see exam details from prior visit is as below.     Physical Exam  Constitutional: He appears well-developed and well-nourished. No distress.  Mildly Obese  HENT:  Head: Atraumatic.  Eyes: Conjunctivae are normal.  Neck: Neck supple. No JVD present. No thyromegaly present.  Cardiovascular: Normal rate, regular rhythm, normal heart sounds and intact distal pulses. Exam reveals no gallop.  No murmur heard. Pulses:      Carotid pulses are on the right side with bruit and on the left side with bruit. Pulmonary/Chest: Effort normal and breath sounds normal.  Abdominal: Soft. Bowel sounds are normal.  Musculoskeletal: Normal range of motion.        General: No edema.  Neurological: He is alert.  Skin: Skin is warm and dry.  Psychiatric: He has a normal mood and affect.  Vitals reviewed.  Radiology: No results found.  Laboratory examination:    CMP Latest Ref Rng & Units 09/17/2018 07/17/2018 07/19/2016  Glucose 65 - 99 mg/dL 83 91 97  BUN 8 - 27 mg/dL 7(L) 11 8  Creatinine 7.61 - 1.27 mg/dL 6.07 3.71 0.62  Sodium 134 - 144 mmol/L 137 136 140  Potassium 3.5 - 5.2 mmol/L 5.0 5.6(H) 4.2  Chloride 96 - 106 mmol/L 104 100 102  CO2 20 - 29 mmol/L 20 18(L) 26  Calcium 8.6 - 10.2 mg/dL 9.0 69.4 9.8   Total Protein 6.0 - 8.5 g/dL 7.3 8.1 9.0(H)  Total Bilirubin 0.0 - 1.2 mg/dL 0.2 0.9 8.5(I)  Alkaline Phos 39 - 117 IU/L 64 81 60  AST 0 - 40 IU/L 64(H) 123(H) 88(H)  ALT 0 - 44 IU/L 59(H) 107(H) 55   CBC Latest Ref Rng & Units 07/17/2018 07/19/2016  WBC 3.4 - 10.8 x10E3/uL 6.5 7.8  Hemoglobin 13.0 - 17.7 g/dL 12.4(L) 14.8  Hematocrit 37.5 - 51.0 % 36.4(L) 42.7  Platelets 150 - 450 x10E3/uL 254 261   Lipid Panel     Component Value Date/Time   CHOL 158 07/17/2018 1026   TRIG 48 07/17/2018 1026   HDL 83 07/17/2018 1026   LDLCALC 65 07/17/2018 1026   HEMOGLOBIN A1C No results found for: HGBA1C, MPG TSH No results for input(s): TSH in the last 8760 hours.  Cardiac Studies:   Carotid artery duplex 04/11/2018: No hemodynamically significant arterial disease in the internal carotid artery bilaterally. Very mild heterogeneous plaque noted bilaterally. Antegrade right vertebral artery flow. Antegrade left vertebral artery flow.  Abdominal aortic duplex 03/06/2018: No AAA observed. The maximum aorta diameter is 2.45 cm (prox). No evidence of atherosclerotic plaque. Normal flow velocities noted in the aorta and iliac vessels. There is diffuse calcific plaque. Right iliac artery stent appears patent.  ABI 01/02/2018: This exam reveals mildly decreased perfusion of the bilateral lower extremity, RABI 0.91 and LABI 0.88. Mild plaque evident in lower extremity. However triphasic waveform (Normal) noted. Clinical correlation recommended. Compared to 11/21/2017, right ABI has improved from 0.52.  Peripheral arteriogram 12/05/2017: CSI atherectomy followed by stenting of right CIA with 10.0 x 39 mm Viabhan VBX, 100% to 0%. Left CIA 50% stenosis without gradient.  US abdomen 10/31/2017: 1. Diffuse Fatty infiltration of the liver. 2. No acute or suspicious findings.  Lexiscan myoview stress test 09/18/2017: 1. The resting electrocardiogram demonstrated normal sinus rhythm, poor R progression,  normal resting conduction, no resting arrhythmias and non-specific T changes. Stress EKG is non-diagnostic for ischemia as it a pharmacologic stress using Lexiscan. The patient developed significant symptoms which included Flushing. 2. LV is dilated both at rest and stress images. The LV end diastolic volume was 148 mL. Stress and rest SPECT images demonstrate homogeneous tracer distribution throughout the myocardium. Gated SPECT imaging reveals normal myocardial thickening and wall motion. The  left ventricular ejection fraction was low normal (53%). This is a low risk study.  Assessment   PAD (peripheral artery disease) (HCC)  Elevated liver enzymes  Essential hypertension  Tobacco use disorder  Alcohol use  EKG 03/21/2018: Sinus rhythm 79 bpm.  Normal axis.  Borderline first-degree AV block.  Old anteroseptal infarct.  Recommendations:   Have discussed recently obtained lab results with the patient, liver enzymes did improve with holding statin.  Potassium level also improved with medication changes.  He did not have a way to check his blood pressure today, will reevaluate at his next office visit.  Tolerating medications well.  In view of his PAD, I feel that he would benefit from statin therapy and suspect that his elevated liver enzymes are likely multifactorial from alcohol use.  I will start him on low-dose Crestor 5 mg daily as lipids were previously very well controlled.  We will closely monitor his liver enzymes.  Has previously had abdominal ultrasound in June 2019 showing fatty liver.  In view of this and elevated liver enzymes and likely ongoing alcohol abuse, I will refer to GI for further evaluation.  I have again counseled him on complete cessation from alcohol use and encouraged him to not have any alcohol drinks.  He is interested in quitting smoking and was unable to achieve this with use of nicotine patches.  Will consider use of Chantix; however, I have not sent in  prescription today in view of his alcohol use at this time.  Encouraged him to continue to work to quit smoking.    In regard to his PAD, claudication symptoms have remained stable.  He is considering establishing with Dr. Alvester Morin in [place] for primary care, which I have encouraged him to do.  I will see him back in 3 months with repeat CMP at that time.  Toniann Fail, MSN, APRN, FNP-C [facility] Office: [phone] Fax: [phone]

## 2018-09-27 ENCOUNTER — Telehealth: Payer: Self-pay

## 2018-09-27 NOTE — Telephone Encounter (Signed)
Ok so he is fine to hold off appt until he is comfortable to come outside

## 2018-09-27 NOTE — Telephone Encounter (Signed)
Pt called stating that if he called in with his bp readings that he doesn't have to come in to his appt; He doesn't want to come out because of the COVID-19   128/68 pulse 80- yesterday 140/70 pulse 69- today

## 2018-09-27 NOTE — Telephone Encounter (Signed)
He doesnt have a appt until August with me. He will need labs just before his office visit.

## 2018-09-27 NOTE — Telephone Encounter (Signed)
I didn't request that he come in to check his BP, I am okay with him checking at home. Overall looks pretty stable. No changes.

## 2018-10-26 ENCOUNTER — Encounter: Payer: Self-pay | Admitting: Gastroenterology

## 2018-11-12 ENCOUNTER — Telehealth: Payer: Self-pay

## 2018-11-12 NOTE — Telephone Encounter (Signed)
Covid-19 screening questions   Do you now or have you had a fever in the last 14 days?  Do you have any respiratory symptoms of shortness of breath or cough now or in the last 14 days?  Do you have any family members or close contacts with diagnosed or suspected Covid-19 in the past 14 days?  Have you been tested for Covid-19 and found to be positive?       

## 2018-11-13 ENCOUNTER — Ambulatory Visit: Payer: Medicaid Other | Admitting: Gastroenterology

## 2018-11-13 NOTE — Progress Notes (Deleted)
[facility] Consult Note:  History: Patrick Aguirre 11/13/2018  Referring provider: Patient, No Pcp Per  Reason for consult/chief complaint: No chief complaint on file.   Subjective  HPI: Recent cardiology follow-up note was reviewed, as this was the referring provider.  Patient has peripheral arterial disease with claudication reportedly improved after placement of peripheral stent July 2019.  Earlier this year, LFTs elevated, improved after holding his statin therapy.  Crestor recently reintroduced.  That provider had some concern that alcohol may be contributing to the liver lab elevation.   ROS:  Review of Systems   Past Medical History: Past Medical History:  Diagnosis Date  . Arthritis   . Chronic back pain   . Environmental allergies      Past Surgical History: Past Surgical History:  Procedure Laterality Date  . LOWER EXTREMITY ANGIOGRAPHY N/A 12/05/2017   Procedure: LOWER EXTREMITY ANGIOGRAPHY;  Surgeon: Adrian Prows, MD;  Location: [facility];  Service: Cardiovascular;  Laterality: N/A;  . LUMBAR LAMINECTOMY/DECOMPRESSION MICRODISCECTOMY N/A 07/20/2016   Procedure: Right L4-5 Microdiscectomy;  Surgeon: Marybelle Killings, MD;  Location: [facility];  Service: Orthopedics;  Laterality: N/A;  . NO PAST SURGERIES    . PERIPHERAL VASCULAR ATHERECTOMY  12/05/2017   Procedure: PERIPHERAL VASCULAR ATHERECTOMY;  Surgeon: Adrian Prows, MD;  Location: [facility];  Service: Cardiovascular;;  . PERIPHERAL VASCULAR INTERVENTION  12/05/2017   Procedure: PERIPHERAL VASCULAR INTERVENTION;  Surgeon: Adrian Prows, MD;  Location: [facility];  Service: Cardiovascular;;     Family History: Family History  Problem Relation Age of Onset  . Diabetes Mother   . Hypertension Mother   . Heart disease Sister     Social History: Social History   Socioeconomic History  . Marital status: Married    Spouse name: Not on file  . Number of children: 1  . Years  of education: Not on file  . Highest education level: Not on file  Occupational History  . Not on file  Social Needs  . Financial resource strain: Not on file  . Food insecurity    Worry: Not on file    Inability: Not on file  . Transportation needs    Medical: Not on file    Non-medical: Not on file  Tobacco Use  . Smoking status: Current Every Day Smoker    Packs/day: 0.50  . Smokeless tobacco: Never Used  Substance and Sexual Activity  . Alcohol use: Yes    Comment: 2 beers daily  . Drug use: No  . Sexual activity: Not on file  Lifestyle  . Physical activity    Days per week: Not on file    Minutes per session: Not on file  . Stress: Not on file  Relationships  . Social Herbalist on phone: Not on file    Gets together: Not on file    Attends religious service: Not on file    Active member of club or organization: Not on file    Attends meetings of clubs or organizations: Not on file    Relationship status: Not on file  Other Topics Concern  . Not on file  Social History Narrative  . Not on file    Allergies: No Known Allergies  Outpatient Meds: Current Outpatient Medications  Medication Sig Dispense Refill  . amLODipine (NORVASC) 5 MG tablet Take 1 tablet (5 mg total) by mouth daily. 180 tablet 3  . aspirin EC 81 MG tablet  Take 81 mg by mouth daily.    . clopidogrel (PLAVIX) 75 MG tablet Take 1 tablet (75 mg total) by mouth daily. 30 tablet 11  . lisinopril (PRINIVIL,ZESTRIL) 5 MG tablet Take 1 tablet (5 mg total) by mouth daily. (Patient taking differently: Take 5 mg by mouth daily. 1/2 tab daily) 90 tablet 3  . loratadine (CLARITIN) 10 MG tablet Take 10 mg by mouth daily.    . rosuvastatin (CRESTOR) 5 MG tablet Take 1 tablet (5 mg total) by mouth daily. 90 tablet 3   No current facility-administered medications for this visit.       ___________________________________________________________________ Objective   Exam:  There were no vitals  taken for this visit.   General: ***   Eyes: sclera anicteric, no redness  ENT: oral mucosa moist without lesions, no cervical or supraclavicular lymphadenopathy  CV: RRR without murmur, S1/S2, no JVD, no peripheral edema  Resp: clear to auscultation bilaterally, normal RR and effort noted  GI: soft, *** tenderness, with active bowel sounds. No guarding or palpable organomegaly noted.  Skin; warm and dry, no rash or jaundice noted  Neuro: awake, alert and oriented x 3. Normal gross motor function and fluent speech  Labs:  CMP Latest Ref Rng & Units 09/17/2018 07/17/2018 07/19/2016  Glucose 65 - 99 mg/dL 83 91 97  BUN 8 - 27 mg/dL 7(L) 11 8  Creatinine 1.610.76 - 1.27 mg/dL 0.960.79 0.451.02 4.091.09  Sodium 134 - 144 mmol/L 137 136 140  Potassium 3.5 - 5.2 mmol/L 5.0 5.6(H) 4.2  Chloride 96 - 106 mmol/L 104 100 102  CO2 20 - 29 mmol/L 20 18(L) 26  Calcium 8.6 - 10.2 mg/dL 9.0 81.110.1 9.8  Total Protein 6.0 - 8.5 g/dL 7.3 8.1 9.0(H)  Total Bilirubin 0.0 - 1.2 mg/dL 0.2 0.9 9.1(Y1.3(H)  Alkaline Phos 39 - 117 IU/L 64 81 60  AST 0 - 40 IU/L 64(H) 123(H) 88(H)  ALT 0 - 44 IU/L 59(H) 107(H) 55   CBC Latest Ref Rng & Units 07/17/2018 07/19/2016  WBC 3.4 - 10.8 x10E3/uL 6.5 7.8  Hemoglobin 13.0 - 17.7 g/dL 12.4(L) 14.8  Hematocrit 37.5 - 51.0 % 36.4(L) 42.7  Platelets 150 - 450 x10E3/uL 254 261     Radiologic Studies:  CLINICAL DATA:  Elevated liver enzymes   EXAM: ULTRASOUND ABDOMEN LIMITED RIGHT UPPER QUADRANT   COMPARISON:  None.   FINDINGS: Gallbladder:   No gallstones or wall thickening visualized. No sonographic Murphy sign noted by sonographer.   Common bile duct:   Diameter: 5 mm   Liver:   Liver is diffusely echogenic indicating fatty infiltration. No focal mass or lesion is identified within the liver. Portal vein is patent on color Doppler imaging with normal direction of blood flow towards the liver.   IMPRESSION: 1. Fatty infiltration of the liver. 2. No acute or  suspicious findings.     Electronically Signed   By: Bary RichardStan  Maynard M.D.   On: 11/01/2017 09:17   Assessment: No diagnosis found.  ***  Plan:  ***  Thank you for the courtesy of this consult.  Please call me with any questions or concerns.  Charlie PitterHenry L Danis III  CC: Referring provider noted above\

## 2018-11-13 NOTE — Telephone Encounter (Signed)
Pt no- showed for his appointment.COVID19 screening not completed.

## 2018-12-04 ENCOUNTER — Telehealth: Payer: Self-pay

## 2018-12-04 NOTE — Telephone Encounter (Signed)
Covid-19 screening questions   Do you now or have you had a fever in the last 14 days? No   Do you have any respiratory symptoms of shortness of breath or cough now or in the last 14 days? Yes, cough, diagnosed with bronchitis a few weeks ago.   Do you have any family members or close contacts with diagnosed or suspected Covid-19 in the past 14 days? no  Have you been tested for Covid-19 and found to be positive? no

## 2018-12-05 ENCOUNTER — Other Ambulatory Visit: Payer: Medicaid Other

## 2018-12-05 ENCOUNTER — Other Ambulatory Visit (INDEPENDENT_AMBULATORY_CARE_PROVIDER_SITE_OTHER): Payer: Medicaid Other

## 2018-12-05 ENCOUNTER — Other Ambulatory Visit: Payer: Self-pay

## 2018-12-05 ENCOUNTER — Ambulatory Visit (INDEPENDENT_AMBULATORY_CARE_PROVIDER_SITE_OTHER): Payer: Medicaid Other | Admitting: Gastroenterology

## 2018-12-05 ENCOUNTER — Encounter: Payer: Self-pay | Admitting: Gastroenterology

## 2018-12-05 VITALS — BP 144/72 | HR 96 | Temp 98.2°F | Ht 71.0 in | Wt 212.0 lb

## 2018-12-05 DIAGNOSIS — R740 Nonspecific elevation of levels of transaminase and lactic acid dehydrogenase [LDH]: Secondary | ICD-10-CM | POA: Diagnosis not present

## 2018-12-05 DIAGNOSIS — R160 Hepatomegaly, not elsewhere classified: Secondary | ICD-10-CM

## 2018-12-05 DIAGNOSIS — R7401 Elevation of levels of liver transaminase levels: Secondary | ICD-10-CM

## 2018-12-05 LAB — HEPATIC FUNCTION PANEL
ALT: 49 U/L (ref 0–53)
AST: 83 U/L — ABNORMAL HIGH (ref 0–37)
Albumin: 4.8 g/dL (ref 3.5–5.2)
Alkaline Phosphatase: 69 U/L (ref 39–117)
Bilirubin, Direct: 0.3 mg/dL (ref 0.0–0.3)
Total Bilirubin: 1 mg/dL (ref 0.2–1.2)
Total Protein: 8.6 g/dL — ABNORMAL HIGH (ref 6.0–8.3)

## 2018-12-05 LAB — PROTIME-INR
INR: 1.1 ratio — ABNORMAL HIGH (ref 0.8–1.0)
Prothrombin Time: 12.4 s (ref 9.6–13.1)

## 2018-12-05 NOTE — Progress Notes (Signed)
[facility] Consult Note:  History: Patrick MattocksRicky Aguirre 12/05/2018  Referring provider: Referred by his cardiology provider, [name], NP  Reason for consult/chief complaint: Elevated Hepatic Enzymes   Subjective  HPI: I reviewed the most recent cardiology follow-up note from 08/07/2018 describing his peripheral arterial disease with prior intervention as well as hypertension, elevated LFTs that improved after holding Crestor, and ongoing tobacco use.  It sounds like the patient was also drinking alcohol to excess prior to that visit.  This is a 58 year old man referred by his cardiology practice for elevated LFTs.  It sounds like it is been going on since at least last year, got worse several months ago and more recently somewhat improved after decrease in Crestor dose and alcohol use. He is now having "no more than 2 or 3 beers" a few days a week, and says before it was "a lot more". He denies a history of hepatitis infection, believes he may have had some hepatitis related vaccine, has never used IV drugs, cocaine or shared needles for any reason.  He has no previous known close contact with hepatitis positive individual.  He denies abdominal pain, nausea, vomiting, early satiety, weight loss, change in bowel habits or rectal bleeding.  ROS:  Review of Systems  Constitutional: Negative for appetite change and unexpected weight change.  HENT: Negative for mouth sores and voice change.   Eyes: Negative for pain and redness.  Respiratory: Negative for cough and shortness of breath.   Cardiovascular: Negative for chest pain and palpitations.  Genitourinary: Negative for dysuria and hematuria.  Musculoskeletal: Positive for arthralgias and back pain. Negative for myalgias.  Skin: Negative for pallor and rash.  Neurological: Negative for weakness and headaches.  Hematological: Negative for adenopathy.     Past Medical History: Past Medical History:  Diagnosis  Date  . Arthritis   . Chronic back pain   . Environmental allergies      Past Surgical History: Past Surgical History:  Procedure Laterality Date  . LOWER EXTREMITY ANGIOGRAPHY N/A 12/05/2017   Procedure: LOWER EXTREMITY ANGIOGRAPHY;  Surgeon: Yates DecampGanji, Jay, MD;  [facility];  Service: Cardiovascular;  Laterality: N/A;  . LUMBAR LAMINECTOMY/DECOMPRESSION MICRODISCECTOMY N/A 07/20/2016   Procedure: Right L4-5 Microdiscectomy;  Surgeon: Eldred MangesMark C Yates, MD;  Location: [facility] OR;  Service: Orthopedics;  Laterality: N/A;  . NO PAST SURGERIES    . PERIPHERAL VASCULAR ATHERECTOMY  12/05/2017   Procedure: PERIPHERAL VASCULAR ATHERECTOMY;  Surgeon: Yates DecampGanji, Jay, MD;  [facility];  Service: Cardiovascular;;  . PERIPHERAL VASCULAR INTERVENTION  12/05/2017   Procedure: PERIPHERAL VASCULAR INTERVENTION;  Surgeon: Yates DecampGanji, Jay, MD;  [facility];  Service: Cardiovascular;;     Family History: Family History  Problem Relation Age of Onset  . Diabetes Mother   . Hypertension Mother   . Heart disease Sister     Social History: Social History   Socioeconomic History  . Marital status: Married    Spouse name: Not on file  . Number of children: 1  . Years of education: Not on file  . Highest education level: Not on file  Occupational History  . Not on file  Social Needs  . Financial resource strain: Not on file  . Food insecurity    Worry: Not on file    Inability: Not on file  . Transportation needs    Medical: Not on file    Non-medical: Not on file  Tobacco Use  . Smoking  status: Current Every Day Smoker    Packs/day: 0.50  . Smokeless tobacco: Never Used  Substance and Sexual Activity  . Alcohol use: Yes    Comment: 2 beers daily  . Drug use: No  . Sexual activity: Not on file  Lifestyle  . Physical activity    Days per week: Not on file    Minutes per session: Not on file  . Stress: Not on file  Relationships  . Social Wellsite geologistconnections     Talks on phone: Not on file    Gets together: Not on file    Attends religious service: Not on file    Active member of club or organization: Not on file    Attends meetings of clubs or organizations: Not on file    Relationship status: Not on file  Other Topics Concern  . Not on file  Social History Narrative  . Not on file   Still smoking, but reports having cut back  Allergies: No Known Allergies  Outpatient Meds: Current Outpatient Medications  Medication Sig Dispense Refill  . aspirin EC 81 MG tablet Take 81 mg by mouth daily.    . clopidogrel (PLAVIX) 75 MG tablet Take 1 tablet (75 mg total) by mouth daily. 30 tablet 11  . loratadine (CLARITIN) 10 MG tablet Take 10 mg by mouth daily.    . rosuvastatin (CRESTOR) 5 MG tablet Take 1 tablet (5 mg total) by mouth daily. 90 tablet 3  . amLODipine (NORVASC) 5 MG tablet Take 1 tablet (5 mg total) by mouth daily. 180 tablet 3  . lisinopril (PRINIVIL,ZESTRIL) 5 MG tablet Take 1 tablet (5 mg total) by mouth daily. (Patient taking differently: Take 5 mg by mouth daily. 1/2 tab daily) 90 tablet 3   No current facility-administered medications for this visit.       ___________________________________________________________________ Objective   Exam:  BP (!) 144/72 (BP Location: Left Arm, Patient Position: Sitting, Cuff Size: Normal)   Pulse 96   Temp 98.2 F (36.8 C) (Oral)   Ht 5\' 11"  (1.803 m)   Wt 212 lb (96.2 kg)   SpO2 96%   BMI 29.57 kg/m    General: Overweight and otherwise well-appearing  Eyes: sclera anicteric, no redness  ENT: oral mucosa moist without lesions, no cervical or supraclavicular lymphadenopathy  CV: RRR without murmur, S1/S2, no JVD, no peripheral edema  Resp: clear to auscultation bilaterally, normal RR and effort noted  GI: soft, no tenderness, with active bowel sounds.  Left lobe liver enlarged 3 fingerbreadths below costal margin, nontender  Skin; warm and dry, no rash or jaundice noted   Neuro: awake, alert and oriented x 3. Normal gross motor function and fluent speech  Labs:  CMP Latest Ref Rng & Units 09/17/2018 07/17/2018 07/19/2016  Glucose 65 - 99 mg/dL 83 91 97  BUN 8 - 27 mg/dL 7(L) 11 8  Creatinine 1.470.76 - 1.27 mg/dL 8.290.79 5.621.02 1.301.09  Sodium 134 - 144 mmol/L 137 136 140  Potassium 3.5 - 5.2 mmol/L 5.0 5.6(H) 4.2  Chloride 96 - 106 mmol/L 104 100 102  CO2 20 - 29 mmol/L 20 18(L) 26  Calcium 8.6 - 10.2 mg/dL 9.0 86.510.1 9.8  Total Protein 6.0 - 8.5 g/dL 7.3 8.1 9.0(H)  Total Bilirubin 0.0 - 1.2 mg/dL 0.2 0.9 7.8(I1.3(H)  Alkaline Phos 39 - 117 IU/L 64 81 60  AST 0 - 40 IU/L 64(H) 123(H) 88(H)  ALT 0 - 44 IU/L 59(H) 107(H) 55   CBC Latest  Ref Rng & Units 07/17/2018 07/19/2016  WBC 3.4 - 10.8 x10E3/uL 6.5 7.8  Hemoglobin 13.0 - 17.7 g/dL 12.4(L) 14.8  Hematocrit 37.5 - 51.0 % 36.4(L) 42.7  Platelets 150 - 450 x10E3/uL 254 261    After the February lab results, a result note from his cardiology provider notes that the Crestor would be stopped and liver labs checked afterwards with recommendations to completely avoid alcohol.  Radiologic Studies:  June 2019 right upper quadrant ultrasound report: CLINICAL DATA:  Elevated liver enzymes   EXAM: ULTRASOUND ABDOMEN LIMITED RIGHT UPPER QUADRANT   COMPARISON:  None.   FINDINGS: Gallbladder:   No gallstones or wall thickening visualized. No sonographic Murphy sign noted by sonographer.   Common bile duct:   Diameter: 5 mm   Liver:   Liver is diffusely echogenic indicating fatty infiltration. No focal mass or lesion is identified within the liver. Portal vein is patent on color Doppler imaging with normal direction of blood flow towards the liver.   IMPRESSION: 1. Fatty infiltration of the liver. 2. No acute or suspicious findings.     Electronically Signed   By: Franki Cabot M.D.   On: 11/01/2017 09:17    Assessment: Encounter Diagnoses  Name Primary?  . Transaminitis Yes  . 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  The excess alcohol use appears to been the major factor, as LFTs improved when that was cut back.  I strongly advised him to be completely abstinent from alcohol. The left lobe hepatomegaly suggest possible cirrhosis.  No thrombocytopenia to suggest advanced portal hypertension.  Plan:  Hepatic function panel, hepatitis B and C serologies, PT/INR CT abdomen with oral and IV contrast to look for changes of cirrhosis  Thank you for the courtesy of this consult.  Please call me with any questions or concerns.  Nelida Meuse III  CC: Referring provider noted above

## 2018-12-05 NOTE — Patient Instructions (Signed)
If you are age 60 or older, your body mass index should be between 23-30. Your Body mass index is 29.57 kg/m. If this is out of the aforementioned range listed, please consider follow up with your Primary Care Provider.  If you are age 16 or younger, your body mass index should be between 19-25. Your Body mass index is 29.57 kg/m. If this is out of the aformentioned range listed, please consider follow up with your Primary Care Provider.   Your provider has requested that you go to the basement level for lab work before leaving today. Press "B" on the elevator. The lab is located at the first door on the left as you exit the elevator.   You have been scheduled for a CT scan of the abdomen and pelvis at [facility] ([address]---this is in the same building as Press [facility]).   You are scheduled on 12-14-2018 at 945AM. You should arrive 15 minutes prior to your appointment time for registration. Please follow the written instructions below on the day of your exam:  WARNING: IF YOU ARE ALLERGIC TO IODINE/X-RAY DYE, PLEASE NOTIFY RADIOLOGY IMMEDIATELY AT [phone]! YOU WILL BE GIVEN A 13 HOUR PREMEDICATION PREP.  1) Do not eat or drink anything after 545AM (4 hours prior to your test) 2) You have been given 2 bottles of oral contrast to drink. The solution may taste better if refrigerated, but do NOT add ice or any other liquid to this solution. Shake well before drinking.      Drink 1 bottle of contrast @ 845AM (1 hour prior to your exam)  You may take any medications as prescribed with a small amount of water, if necessary. If you take any of the following medications: METFORMIN, GLUCOPHAGE, GLUCOVANCE, AVANDAMET, RIOMET, FORTAMET, [place] MET, JANUMET, GLUMETZA or METAGLIP, you MAY be asked to HOLD this medication 48 hours AFTER the exam.  The purpose of you drinking the oral contrast is to aid in the visualization of your intestinal tract. The contrast solution may  cause some diarrhea. Depending on your individual set of symptoms, you may also receive an intravenous injection of x-ray contrast/dye. Plan on being at [facility] for 30 minutes or longer, depending on the type of exam you are having performed.  This test typically takes 30-45 minutes to complete.  If you have any questions regarding your exam or if you need to reschedule, you may call the CT department at [phone] between the hours of 8:00 am and 5:00 pm, Monday-Friday.  ________________________________________________________________________

## 2018-12-06 LAB — HEPATITIS C ANTIBODY
Hepatitis C Ab: NONREACTIVE
SIGNAL TO CUT-OFF: 0.14 (ref <1.00)

## 2018-12-06 LAB — HEPATITIS B SURFACE ANTIGEN: Hepatitis B Surface Ag: NONREACTIVE

## 2018-12-06 LAB — BUN/CREATININE RATIO
BUN: 10 mg/dL (ref 7–25)
Creat: 0.89 mg/dL (ref 0.70–1.25)
GFR, Est African American: 108 mL/min/{1.73_m2} (ref >=60)
GFR, Est Non African American: 93 mL/min/{1.73_m2} (ref >=60)

## 2018-12-06 LAB — HEPATITIS B SURFACE ANTIBODY,QUALITATIVE: Hep B S Ab: NONREACTIVE

## 2018-12-06 LAB — HEPATITIS B CORE ANTIBODY, TOTAL: Hep B Core Total Ab: NONREACTIVE

## 2018-12-06 NOTE — Progress Notes (Signed)
Patient seen by GI. Appears to have alcohol related fatty liver. Advised complete abstinence from alcohol use. Plan to check hepatic function panel, hepatitis B and C serologies, PT/INR, CT abdomen with oral and IV contrast to look for changes of cirrhosis.

## 2018-12-14 ENCOUNTER — Inpatient Hospital Stay: Admission: RE | Admit: 2018-12-14 | Payer: Medicaid Other | Source: Ambulatory Visit

## 2018-12-17 ENCOUNTER — Telehealth: Payer: Self-pay | Admitting: Gastroenterology

## 2018-12-17 ENCOUNTER — Other Ambulatory Visit: Payer: Self-pay

## 2018-12-17 DIAGNOSIS — R7401 Elevation of levels of liver transaminase levels: Secondary | ICD-10-CM

## 2018-12-17 DIAGNOSIS — R160 Hepatomegaly, not elsewhere classified: Secondary | ICD-10-CM

## 2018-12-17 NOTE — Telephone Encounter (Signed)
CT scan was denied by [facility] imaging review service, stating patient needs abdominal ultrasound instead.  Although he had one done June 2019, I suspect this will not be recent enough for their satisfaction.  Please bring it to their attention.  If it is not a recent and of ultrasound, then please schedule right upper quadrant ultrasound.  If that is done and does not give sufficient information, we may then have grounds to get CT scan approval.

## 2018-12-17 NOTE — Telephone Encounter (Signed)
Patient has been scheduled for 12-24-2018 @ 10 am

## 2018-12-24 ENCOUNTER — Other Ambulatory Visit: Payer: Self-pay

## 2018-12-24 ENCOUNTER — Ambulatory Visit (HOSPITAL_COMMUNITY)
Admission: RE | Admit: 2018-12-24 | Discharge: 2018-12-24 | Disposition: A | Discharge status: Home / Self Care | Payer: Medicaid Other | Source: Ambulatory Visit | Attending: Gastroenterology | Admitting: Gastroenterology

## 2018-12-24 DIAGNOSIS — R160 Hepatomegaly, not elsewhere classified: Secondary | ICD-10-CM | POA: Diagnosis present

## 2018-12-24 DIAGNOSIS — R7401 Elevation of levels of liver transaminase levels: Secondary | ICD-10-CM

## 2018-12-24 DIAGNOSIS — R740 Nonspecific elevation of levels of transaminase and lactic acid dehydrogenase [LDH]: Secondary | ICD-10-CM | POA: Insufficient documentation

## 2018-12-25 ENCOUNTER — Other Ambulatory Visit: Payer: Self-pay

## 2018-12-25 DIAGNOSIS — R7989 Other specified abnormal findings of blood chemistry: Secondary | ICD-10-CM

## 2018-12-25 DIAGNOSIS — R160 Hepatomegaly, not elsewhere classified: Secondary | ICD-10-CM

## 2019-01-01 ENCOUNTER — Encounter: Payer: Self-pay | Admitting: Cardiology

## 2019-01-01 ENCOUNTER — Ambulatory Visit: Payer: Medicaid Other | Admitting: Cardiology

## 2019-01-01 ENCOUNTER — Other Ambulatory Visit: Payer: Self-pay

## 2019-01-01 VITALS — BP 155/80 | HR 74 | Ht 71.0 in | Wt 222.2 lb

## 2019-01-01 DIAGNOSIS — I10 Essential (primary) hypertension: Secondary | ICD-10-CM | POA: Diagnosis not present

## 2019-01-01 DIAGNOSIS — I739 Peripheral vascular disease, unspecified: Secondary | ICD-10-CM

## 2019-01-01 DIAGNOSIS — R748 Abnormal levels of other serum enzymes: Secondary | ICD-10-CM | POA: Diagnosis not present

## 2019-01-01 DIAGNOSIS — Z789 Other specified health status: Secondary | ICD-10-CM

## 2019-01-01 DIAGNOSIS — F17200 Nicotine dependence, unspecified, uncomplicated: Secondary | ICD-10-CM | POA: Diagnosis not present

## 2019-01-01 DIAGNOSIS — Z7289 Other problems related to lifestyle: Secondary | ICD-10-CM

## 2019-01-01 MED ORDER — LISINOPRIL 5 MG PO TABS: 5.0000 mg | ORAL_TABLET | Freq: Every day | ORAL | 0 refills | Status: DC

## 2019-01-01 NOTE — Progress Notes (Signed)
Primary Physician/Referring:  Patient, No Pcp Per  Patient ID: Patrick Aguirre, male    DOB: Sep 18, 1960, 58 y.o.   MRN: [id number]  Chief Complaint  Patient presents with  . PAD  . Hypertension    HPI: Patrick Aguirre  is a 58 y.o. male  With chronic tobacco use and chronic back pain s/p L4-5 microdisectomy in Feb 2018. Due to PAD, he underwent successful revascularization of the right common iliac artery with implantation of colored stent on 12/05/2017, since then has noticed significant improvement in claudication.  Patient continues report bilateral leg pain with walking long distances or standing for long periods of time, but feels that this has improved prior to his intervention. Reports he is able to walk longer distances, but does have to use a cane. No chest pain or shortness of breath.   Recent labs had revealed elevated liver enzymes, he was referred to GI for evaluation. Liver enzyme elevation felt to be related to alcohol use. He is currently undergoing further workup with GI. Recent labs show improvement in liver enzymes.  He reports smoking half a pack per day. Continues to drink some beers some days, and admits to drinking more than he should have last week as his mother passed away and his close friend was found to have stage 4 cancer.   Past Medical History:  Diagnosis Date  . Arthritis   . Chronic back pain   . Environmental allergies     Past Surgical History:  Procedure Laterality Date  . LOWER EXTREMITY ANGIOGRAPHY N/A 12/05/2017   Procedure: LOWER EXTREMITY ANGIOGRAPHY;  Surgeon: Yates DecampGanji, Jay, MD;  [facility];  Service: Cardiovascular;  Laterality: N/A;  . LUMBAR LAMINECTOMY/DECOMPRESSION MICRODISCECTOMY N/A 07/20/2016   Procedure: Right L4-5 Microdiscectomy;  Surgeon: Eldred MangesMark C Yates, MD;  Location: [facility] OR;  Service: Orthopedics;  Laterality: N/A;  . NO PAST SURGERIES    . PERIPHERAL VASCULAR ATHERECTOMY  12/05/2017   Procedure: PERIPHERAL VASCULAR  ATHERECTOMY;  Surgeon: Yates DecampGanji, Jay, MD;  [facility];  Service: Cardiovascular;;  . PERIPHERAL VASCULAR INTERVENTION  12/05/2017   Procedure: PERIPHERAL VASCULAR INTERVENTION;  Surgeon: Yates DecampGanji, Jay, MD;  [facility];  Service: Cardiovascular;;    Social History   Socioeconomic History  . Marital status: Married    Spouse name: Not on file  . Number of children: 1  . Years of education: Not on file  . Highest education level: Not on file  Occupational History  . Not on file  Social Needs  . Financial resource strain: Not on file  . Food insecurity    Worry: Not on file    Inability: Not on file  . Transportation needs    Medical: Not on file    Non-medical: Not on file  Tobacco Use  . Smoking status: Current Every Day Smoker    Packs/day: 0.50  . Smokeless tobacco: Never Used  Substance and Sexual Activity  . Alcohol use: Yes    Comment: 2 beers daily  . Drug use: No  . Sexual activity: Not on file  Lifestyle  . Physical activity    Days per week: Not on file    Minutes per session: Not on file  . Stress: Not on file  Relationships  . Social Musicianconnections    Talks on phone: Not on file    Gets together: Not on file    Attends religious service: Not on file    Active member of club  or organization: Not on file    Attends meetings of clubs or organizations: Not on file    Relationship status: Not on file  . Intimate partner violence    Fear of current or ex partner: Not on file    Emotionally abused: Not on file    Physically abused: Not on file    Forced sexual activity: Not on file  Other Topics Concern  . Not on file  Social History Narrative  . Not on file    Current Outpatient Medications on File Prior to Visit  Medication Sig Dispense Refill  . amLODipine (NORVASC) 5 MG tablet Take 1 tablet (5 mg total) by mouth daily. 180 tablet 3  . aspirin EC 81 MG tablet Take 81 mg by mouth daily.    Marland Kitchen lisinopril (PRINIVIL,ZESTRIL) 5 MG  tablet Take 1 tablet (5 mg total) by mouth daily. (Patient taking differently: Take 5 mg by mouth daily. 1/2 tab daily) 90 tablet 3  . loratadine (CLARITIN) 10 MG tablet Take 10 mg by mouth daily.    . rosuvastatin (CRESTOR) 5 MG tablet Take 1 tablet (5 mg total) by mouth daily. 90 tablet 3   No current facility-administered medications on file prior to visit.     Review of Systems  Constitution: Negative for decreased appetite, malaise/fatigue, weight gain and weight loss.  Eyes: Negative for visual disturbance.  Cardiovascular: Positive for claudication (bilateral calves; improved from before). Negative for chest pain, dyspnea on exertion, leg swelling, orthopnea, palpitations and syncope.  Respiratory: Negative for hemoptysis and wheezing.   Endocrine: Negative for cold intolerance and heat intolerance.  Hematologic/Lymphatic: Does not bruise/bleed easily.  Skin: Negative for nail changes.  Musculoskeletal: Positive for back pain. Negative for muscle weakness and myalgias.  Gastrointestinal: Negative for abdominal pain, change in bowel habit, nausea and vomiting.  Neurological: Negative for difficulty with concentration, dizziness, focal weakness and headaches.  Psychiatric/Behavioral: Negative for altered mental status and suicidal ideas.  All other systems reviewed and are negative.     Objective  Blood pressure (!) 155/80, pulse 74, height 5\' 11"  (1.803 m), weight 222 lb 3.2 oz (100.8 kg), SpO2 98 %. Body mass index is 30.99 kg/m.     Physical Exam  Constitutional: He appears well-developed and well-nourished. No distress.  Mildly Obese  HENT:  Head: Atraumatic.  Eyes: Conjunctivae are normal.  Neck: Neck supple. No JVD present. No thyromegaly present.  Cardiovascular: Normal rate, regular rhythm, normal heart sounds and intact distal pulses. Exam reveals no gallop.  No murmur heard. Pulses:      Carotid pulses are on the right side with bruit and on the left side with  bruit. Pulmonary/Chest: Effort normal and breath sounds normal.  Abdominal: Soft. Bowel sounds are normal.  Musculoskeletal: Normal range of motion.        General: No edema.  Neurological: He is alert.  Skin: Skin is warm and dry.  Psychiatric: He has a normal mood and affect.  Vitals reviewed.  Radiology: No results found.  Laboratory examination:    CMP Latest Ref Rng & Units 12/05/2018 09/17/2018 07/17/2018  Glucose 65 - 99 mg/dL - 83 91  BUN 7 - 25 mg/dL 10 7(L) 11  Creatinine 0.70 - 1.25 mg/dL 0.89 0.79 1.02  Sodium 134 - 144 mmol/L - 137 136  Potassium 3.5 - 5.2 mmol/L - 5.0 5.6(H)  Chloride 96 - 106 mmol/L - 104 100  CO2 20 - 29 mmol/L - 20 18(L)  Calcium 8.6 -  10.2 mg/dL - 9.0 16.110.1  Total Protein 6.0 - 8.3 g/dL 0.9(U8.6(H) 7.3 8.1  Total Bilirubin 0.2 - 1.2 mg/dL 1.0 0.2 0.9  Alkaline Phos 39 - 117 U/L 69 64 81  AST 0 - 37 U/L 83(H) 64(H) 123(H)  ALT 0 - 53 U/L 49 59(H) 107(H)   CBC Latest Ref Rng & Units 07/17/2018 07/19/2016  WBC 3.4 - 10.8 x10E3/uL 6.5 7.8  Hemoglobin 13.0 - 17.7 g/dL 12.4(L) 14.8  Hematocrit 37.5 - 51.0 % 36.4(L) 42.7  Platelets 150 - 450 x10E3/uL 254 261   Lipid Panel     Component Value Date/Time   CHOL 158 07/17/2018 1026   TRIG 48 07/17/2018 1026   HDL 83 07/17/2018 1026   LDLCALC 65 07/17/2018 1026   HEMOGLOBIN A1C No results found for: HGBA1C, MPG TSH No results for input(s): TSH in the last 8760 hours.  Cardiac Studies:   Carotid artery duplex 04/09/2018: No hemodynamically significant arterial disease in the internal carotid artery bilaterally. Very mild heterogeneous plaque noted bilaterally. Antegrade right vertebral artery flow. Antegrade left vertebral artery flow.  Abdominal aortic duplex 03/06/2018: No AAA observed. The maximum aorta diameter is 2.45 cm (prox). No evidence of atherosclerotic plaque. Normal flow velocities noted in the aorta and iliac vessels. There is diffuse calcific plaque. Right iliac artery stent appears  patent.  ABI 01/02/2018: This exam reveals mildly decreased perfusion of the bilateral lower extremity, RABI 0.91 and LABI 0.88. Mild plaque evident in lower extremity. However triphasic waveform (Normal) noted. Clinical correlation recommended. Compared to 11/21/2017, right ABI has improved from 0.52.  Peripheral arteriogram 12/05/2017: CSI atherectomy followed by stenting of right CIA with 10.0 x 39 mm Viabhan VBX, 100% to 0%. Left CIA 50% stenosis without gradient.  US abdomen 10/31/2017: 1. Diffuse Fatty infiltration of the liver. 2. No acute or suspicious findings.  Lexiscan myoview stress test 09/18/2017: 1. The resting electrocardiogram demonstrated normal sinus rhythm, poor R progression, normal resting conduction, no resting arrhythmias and non-specific T changes. Stress EKG is non-diagnostic for ischemia as it a pharmacologic stress using Lexiscan. The patient developed significant symptoms which included Flushing. 2. LV is dilated both at rest and stress images. The LV end diastolic volume was 148 mL. Stress and rest SPECT images demonstrate homogeneous tracer distribution throughout the myocardium. Gated SPECT imaging reveals normal myocardial thickening and wall motion. The left ventricular ejection fraction was low normal (53%). This is a low risk study.  Assessment     ICD-10-CM   1. Essential hypertension  I10 EKG 12-Lead  2. PAD (peripheral artery disease) (HCC)  I73.9 EKG 12-Lead  3. Elevated liver enzymes  R74.8   4. Tobacco use disorder  F17.200   5. Alcohol use  Z72.89     EKG 01/01/2019: Normal sinus rhythm at 66 bpm, normal axis, Poor R wave progression cannot exclude anterior infarct old. No evidence of ischemia.   Recommendations:   Patient is presently doing well, no complaints today.  Leg pain has significantly improved.  He does walk with a cane now.  I have encouraged him to continue to walk to help with this PAD.  No evidence of ischemic limb.  Vascular  exam is unchanged.  Blood pressure is elevated today, I suspect poor dietary compliance is also contributing to this as he admits to eating [place] sausage and drinking a Powerade prior to our office visit.  Have counseled him on sodium restriction.  I will increase his lisinopril up to 5 mg daily.  He previously had some hyperkalemia, but is now resolved.  We will closely monitor this.  Liver enzymes have been stable.  Continue to feel that we should continue with statin therapy in view of his PAD.  He was again counseled on the importance of complete alcohol cessation to help with this as this is likely etiology for his elevated liver enzymes.  He is continuing to work on this.  Does report having some drinks last week as his mother had passed away and also a close friend was diagnosed with stage IV cancer.  Encouraged him to continue to work to cut back and to eventually completely quit.  I have also advised him to continue to work on smoking cessation to help with this.  I will see him back in 3 months for follow-up.    Toniann FailAshton Haynes Kelley, MSN, APRN, FNP-C [facility] Office: [phone] Fax: [phone]

## 2019-01-07 ENCOUNTER — Ambulatory Visit (INDEPENDENT_AMBULATORY_CARE_PROVIDER_SITE_OTHER)
Admission: RE | Admit: 2019-01-07 | Discharge: 2019-01-07 | Disposition: A | Discharge status: Home / Self Care | Payer: Medicaid Other | Source: Ambulatory Visit | Attending: Gastroenterology | Admitting: Gastroenterology

## 2019-01-07 ENCOUNTER — Other Ambulatory Visit: Payer: Self-pay

## 2019-01-07 DIAGNOSIS — R160 Hepatomegaly, not elsewhere classified: Secondary | ICD-10-CM

## 2019-01-07 DIAGNOSIS — R7401 Elevation of levels of liver transaminase levels: Secondary | ICD-10-CM

## 2019-01-07 MED ORDER — IOHEXOL 300 MG/ML  SOLN
100.0000 mL | Freq: Once | INTRAMUSCULAR | Status: AC | PRN
  Administered 2019-01-07: 11:00:00 100 mL via INTRAVENOUS

## 2019-01-13 ENCOUNTER — Other Ambulatory Visit: Payer: Self-pay

## 2019-01-13 ENCOUNTER — Emergency Department (HOSPITAL_COMMUNITY)
Admission: EM | Admit: 2019-01-13 | Discharge: 2019-01-13 | Disposition: A | Discharge status: Home / Self Care | Payer: Medicaid Other | Attending: Emergency Medicine | Admitting: Emergency Medicine

## 2019-01-13 ENCOUNTER — Encounter (HOSPITAL_COMMUNITY): Payer: Self-pay | Admitting: Emergency Medicine

## 2019-01-13 ENCOUNTER — Emergency Department (HOSPITAL_COMMUNITY): Payer: Medicaid Other

## 2019-01-13 DIAGNOSIS — Y999 Unspecified external cause status: Secondary | ICD-10-CM | POA: Diagnosis not present

## 2019-01-13 DIAGNOSIS — Y929 Unspecified place or not applicable: Secondary | ICD-10-CM | POA: Insufficient documentation

## 2019-01-13 DIAGNOSIS — Y908 Blood alcohol level of 240 mg/100 ml or more: Secondary | ICD-10-CM | POA: Diagnosis not present

## 2019-01-13 DIAGNOSIS — W010XXA Fall on same level from slipping, tripping and stumbling without subsequent striking against object, initial encounter: Secondary | ICD-10-CM | POA: Insufficient documentation

## 2019-01-13 DIAGNOSIS — M25552 Pain in left hip: Secondary | ICD-10-CM | POA: Diagnosis not present

## 2019-01-13 DIAGNOSIS — F10920 Alcohol use, unspecified with intoxication, uncomplicated: Secondary | ICD-10-CM

## 2019-01-13 DIAGNOSIS — F17210 Nicotine dependence, cigarettes, uncomplicated: Secondary | ICD-10-CM | POA: Diagnosis not present

## 2019-01-13 DIAGNOSIS — Z7982 Long term (current) use of aspirin: Secondary | ICD-10-CM | POA: Diagnosis not present

## 2019-01-13 DIAGNOSIS — I10 Essential (primary) hypertension: Secondary | ICD-10-CM | POA: Diagnosis not present

## 2019-01-13 DIAGNOSIS — F10929 Alcohol use, unspecified with intoxication, unspecified: Secondary | ICD-10-CM | POA: Insufficient documentation

## 2019-01-13 DIAGNOSIS — Y939 Activity, unspecified: Secondary | ICD-10-CM | POA: Diagnosis not present

## 2019-01-13 DIAGNOSIS — E871 Hypo-osmolality and hyponatremia: Secondary | ICD-10-CM

## 2019-01-13 DIAGNOSIS — E876 Hypokalemia: Secondary | ICD-10-CM | POA: Diagnosis not present

## 2019-01-13 LAB — CBC WITH DIFFERENTIAL/PLATELET
Abs Immature Granulocytes: 0.03 10*3/uL (ref 0.00–0.07)
Basophils Absolute: 0.1 10*3/uL (ref 0.0–0.1)
Basophils Relative: 1 %
Eosinophils Absolute: 0.2 10*3/uL (ref 0.0–0.5)
Eosinophils Relative: 3 %
HCT: 39.3 % (ref 39.0–52.0)
Hemoglobin: 12.6 g/dL — ABNORMAL LOW (ref 13.0–17.0)
Immature Granulocytes: 0 %
Lymphocytes Relative: 47 %
Lymphs Abs: 4 10*3/uL (ref 0.7–4.0)
MCH: 31.1 pg (ref 26.0–34.0)
MCHC: 32.1 g/dL (ref 30.0–36.0)
MCV: 97 fL (ref 80.0–100.0)
Monocytes Absolute: 0.7 10*3/uL (ref 0.1–1.0)
Monocytes Relative: 8 %
Neutro Abs: 3.5 10*3/uL (ref 1.7–7.7)
Neutrophils Relative %: 41 %
Platelets: 366 10*3/uL (ref 150–400)
RBC: 4.05 MIL/uL — ABNORMAL LOW (ref 4.22–5.81)
RDW: 12.1 % (ref 11.5–15.5)
WBC: 8.5 10*3/uL (ref 4.0–10.5)
nRBC: 0 % (ref 0.0–0.2)

## 2019-01-13 LAB — COMPREHENSIVE METABOLIC PANEL
ALT: 30 U/L (ref 0–44)
AST: 55 U/L — ABNORMAL HIGH (ref 15–41)
Albumin: 3.9 g/dL (ref 3.5–5.0)
Alkaline Phosphatase: 59 U/L (ref 38–126)
Anion gap: 9 (ref 5–15)
BUN: 6 mg/dL (ref 6–20)
CO2: 21 mmol/L — ABNORMAL LOW (ref 22–32)
Calcium: 8.6 mg/dL — ABNORMAL LOW (ref 8.9–10.3)
Chloride: 100 mmol/L (ref 98–111)
Creatinine, Ser: 0.71 mg/dL (ref 0.61–1.24)
GFR calc Af Amer: >60 mL/min (ref >60)
GFR calc non Af Amer: >60 mL/min (ref >60)
Glucose, Bld: 100 mg/dL — ABNORMAL HIGH (ref 70–99)
Potassium: 4 mmol/L (ref 3.5–5.1)
Sodium: 130 mmol/L — ABNORMAL LOW (ref 135–145)
Total Bilirubin: 0.4 mg/dL (ref 0.3–1.2)
Total Protein: 8.1 g/dL (ref 6.5–8.1)

## 2019-01-13 LAB — ETHANOL: Alcohol, Ethyl (B): 263 mg/dL — ABNORMAL HIGH (ref <10)

## 2019-01-13 MED ORDER — NAPROXEN 500 MG PO TABS: 500.0000 mg | ORAL_TABLET | Freq: Two times a day (BID) | ORAL | 0 refills | Status: DC

## 2019-01-13 NOTE — ED Triage Notes (Signed)
Patient complains of falling repeatedly in past 2 days. Complains of pain in left leg and left elbow. Patient admits to being intoxicated this morning.

## 2019-01-13 NOTE — ED Notes (Signed)
Pt's ride has arrived.

## 2019-01-13 NOTE — ED Provider Notes (Signed)
[facility] EMERGENCY DEPARTMENT Provider Note   CSN: [id number] Arrival date & time: 01/13/19  1032     History   Chief Complaint Chief Complaint  Patient presents with  . Fall    HPI Patrick Aguirre is a 58 y.o. male.     HPI   Patrick Aguirre is a 58 y.o. male who presents to the Emergency Department complaining of pain of his left hip and frequent falls.  Symptoms have been present for 2 days.  He states that his hip "gives away" and causes him to fall.  He describes a sharp pain to his hip that radiates to his upper thigh.  Pain is associated only with weightbearing.  Improves at rest.  He can also complained of pain to his left elbow that has since resolved.  Patient also admits to drinking two 16 ounce cans of beer this morning.  He denies head injury, dizziness,  LOC, neck or low back pain.  Past Medical History:  Diagnosis Date  . Arthritis   . Chronic back pain   . Environmental allergies     Patient Active Problem List   Diagnosis Date Noted  . Alcohol abuse 09/10/2018  . Elevated liver enzymes 08/07/2018  . Hyperkalemia 08/07/2018  . Essential hypertension 08/07/2018  . PAD (peripheral artery disease) ([place]) 08/07/2018  . Tobacco use disorder 08/07/2018  . Felon of finger s/p I and D 02/15/18   . Claudication in peripheral vascular disease ([place]) 12/04/2017    Past Surgical History:  Procedure Laterality Date  . LOWER EXTREMITY ANGIOGRAPHY N/A 12/05/2017   Procedure: LOWER EXTREMITY ANGIOGRAPHY;  Surgeon: Adrian Prows, MD;  Location: [place] [facility];  Service: Cardiovascular;  Laterality: N/A;  . LUMBAR LAMINECTOMY/DECOMPRESSION MICRODISCECTOMY N/A 07/20/2016   Procedure: Right L4-5 Microdiscectomy;  Surgeon: Marybelle Killings, MD;  Location: [place];  Service: Orthopedics;  Laterality: N/A;  . NO PAST SURGERIES    . PERIPHERAL VASCULAR ATHERECTOMY  12/05/2017   Procedure: PERIPHERAL VASCULAR ATHERECTOMY;  Surgeon: Adrian Prows, MD;  Location: [place] [facility];  Service:  Cardiovascular;;  . PERIPHERAL VASCULAR INTERVENTION  12/05/2017   Procedure: PERIPHERAL VASCULAR INTERVENTION;  Surgeon: Adrian Prows, MD;  Location: [place] [facility];  Service: Cardiovascular;;        Home Medications    Prior to Admission medications   Medication Sig Start Date End Date Taking? Authorizing Provider  amLODipine (NORVASC) 5 MG tablet Take 1 tablet (5 mg total) by mouth daily. 08/07/18 11/05/18  Miquel Dunn, NP  aspirin EC 81 MG tablet Take 81 mg by mouth daily.    [provider]  lisinopril (ZESTRIL) 5 MG tablet Take 1 tablet (5 mg total) by mouth daily. 01/01/19 04/01/19  Miquel Dunn, NP  loratadine (CLARITIN) 10 MG tablet Take 10 mg by mouth daily.    [provider]  rosuvastatin (CRESTOR) 5 MG tablet Take 1 tablet (5 mg total) by mouth daily. 09/25/18 12/24/18  Miquel Dunn, NP    Family History Family History  Problem Relation Age of Onset  . Diabetes Mother   . Hypertension Mother   . Heart disease Sister     Social History Social History   Tobacco Use  . Smoking status: Current Every Day Smoker    Packs/day: 0.50  . Smokeless tobacco: Never Used  Substance Use Topics  . Alcohol use: Yes    Comment: 2 beers daily  . Drug use: No     Allergies  Patient has no known allergies.   Review of Systems Review of Systems  Constitutional: Negative for chills and fever.  Respiratory: Negative for chest tightness and shortness of breath.   Cardiovascular: Negative for chest pain.  Gastrointestinal: Negative for abdominal pain, nausea and vomiting.  Musculoskeletal: Positive for arthralgias (Left hip pain) and joint swelling.  Skin: Negative for color change and wound.  Neurological: Negative for dizziness, seizures, syncope, speech difficulty, weakness and headaches.  Psychiatric/Behavioral: Negative for confusion and decreased concentration. The patient is not nervous/anxious.   All other systems reviewed and  are negative.    Physical Exam Updated Vital Signs BP 123/73 (BP Location: Left Arm)   Pulse 64   Temp 97.8 F (36.6 C) (Oral)   Resp 18   Ht 5\' 11"  (1.803 m)   Wt 100.8 kg   SpO2 100%   BMI 30.99 kg/m   Physical Exam Vitals signs and nursing note reviewed.  Constitutional:      General: He is not in acute distress.    Appearance: He is well-developed.  HENT:     Head: Atraumatic.     Mouth/Throat:     Mouth: Mucous membranes are moist.  Eyes:     Extraocular Movements: Extraocular movements intact.     Conjunctiva/sclera: Conjunctivae normal.     Pupils: Pupils are equal, round, and reactive to light.  Neck:     Musculoskeletal: Normal range of motion. No muscular tenderness.  Cardiovascular:     Rate and Rhythm: Normal rate and regular rhythm.     Pulses: Normal pulses.  Pulmonary:     Effort: Pulmonary effort is normal.     Breath sounds: Normal breath sounds.  Abdominal:     General: There is no distension.     Palpations: Abdomen is soft. There is no mass.     Tenderness: There is no abdominal tenderness. There is no guarding.  Musculoskeletal:        General: Tenderness and signs of injury present. No swelling.     Comments: ttp of the anterior and lateral left hip.  No bony deformity.  No erythema, effusion, or rash.  Patient has full range of motion of the joint.  No tenderness of the lumbar spine.  Normal range of motion of the left elbow without edema.  Skin:    General: Skin is warm.     Capillary Refill: Capillary refill takes less than 2 seconds.     Findings: No erythema.  Neurological:     General: No focal deficit present.     Mental Status: He is alert.     GCS: GCS eye subscore is 4. GCS verbal subscore is 5. GCS motor subscore is 6.     Sensory: Sensation is intact. No sensory deficit.     Motor: Motor function is intact. No weakness or abnormal muscle tone.     Coordination: Coordination is intact.     Gait: Gait normal.     Comments: CN  II-XII intact.  Speech is slow but clear.  No pronator drift.  Normal finger-nose testing.  Mentating well.      ED Treatments / Results  Labs (all labs ordered are listed, but only abnormal results are displayed) Labs Reviewed  COMPREHENSIVE METABOLIC PANEL - Abnormal; Notable for the following components:      Result Value   Sodium 130 (*)    CO2 21 (*)    Glucose, Bld 100 (*)    Calcium 8.6 (*)  AST 55 (*)    All other components within normal limits  ETHANOL - Abnormal; Notable for the following components:   Alcohol, Ethyl (B) 263 (*)    All other components within normal limits  CBC WITH DIFFERENTIAL/PLATELET - Abnormal; Notable for the following components:   RBC 4.05 (*)    Hemoglobin 12.6 (*)    All other components within normal limits    EKG None  Radiology  Dg Hip Unilat W Or Wo Pelvis 2-3 Views Left  Result Date: 01/13/2019 CLINICAL DATA:  Multiple falls. EXAM: DG HIP (WITH OR WITHOUT PELVIS) 2-3V LEFT COMPARISON:  None. FINDINGS: There is no evidence of hip fracture or dislocation. There is no evidence of arthropathy or other focal bone abnormality. IMPRESSION: Negative. Electronically Signed   By: Kennith CenterEric  Mansell M.D.   On: 01/13/2019 12:28      Procedures Procedures (including critical care time)  Medications Ordered in ED Medications - No data to display   Initial Impression / Assessment and Plan / ED Course  I have reviewed the triage vital signs and the nursing notes.  Pertinent labs & imaging results that were available during my care of the patient were reviewed by me and considered in my medical decision making (see chart for details).        Pt with left hip pain x 2 days.  XR negative for bony injury.  Felt to be musckloskeletal.  Of note, labs show that he is hyponatremic, and his ETOH is 263.  He does admit to drinking two16 ounce beers this morning prior to arrival.  He is requesting discharge home.  I have recommended that he stay for  IV hydration, discussed risk and he continues to request discharge.  Have discussed this with Dr. Estell HarpinZammit.  He does have transportation home by friend witnessed by nursing staff.  He ambulates in the department and gait is steady.  No focal neuro deficits.  He agrees to oral hydration at home. Strict return precautions discussed.  Pt does not appear intoxicated.    Final Clinical Impressions(s) / ED Diagnoses   Final diagnoses:  Pain of left hip joint  Hyponatremia  Alcoholic intoxication without complication [facility] [employer](HCC)    ED Discharge Orders    None       Pauline Ausriplett, Shaymus Eveleth, PA-C 01/15/19 1515    Bethann BerkshireZammit, Joseph, MD 01/27/19 [phone]

## 2019-01-13 NOTE — ED Notes (Addendum)
Pt waiting in room for ride due to alcohol level. Pt reports ride is on the way

## 2019-01-13 NOTE — Discharge Instructions (Addendum)
Your sodium level today was low.  This may cause cramping of your muscles and nausea or vomiting.  It is important that you drink plenty of water for the next few days.  You may apply ice or heat on and off to your left hip.  Follow-up with your primary doctor for recheck.  Is also important that you try to stop drinking alcohol.

## 2019-03-19 ENCOUNTER — Encounter (HOSPITAL_COMMUNITY): Payer: Self-pay

## 2019-03-19 ENCOUNTER — Emergency Department (HOSPITAL_COMMUNITY)
Admission: EM | Admit: 2019-03-19 | Discharge: 2019-03-19 | Disposition: A | Discharge status: Home / Self Care | Payer: Medicaid Other | Attending: Emergency Medicine | Admitting: Emergency Medicine

## 2019-03-19 ENCOUNTER — Other Ambulatory Visit: Payer: Self-pay

## 2019-03-19 ENCOUNTER — Emergency Department (HOSPITAL_COMMUNITY): Payer: Medicaid Other

## 2019-03-19 DIAGNOSIS — Z7982 Long term (current) use of aspirin: Secondary | ICD-10-CM | POA: Insufficient documentation

## 2019-03-19 DIAGNOSIS — I10 Essential (primary) hypertension: Secondary | ICD-10-CM | POA: Insufficient documentation

## 2019-03-19 DIAGNOSIS — Z79899 Other long term (current) drug therapy: Secondary | ICD-10-CM | POA: Diagnosis not present

## 2019-03-19 DIAGNOSIS — F17200 Nicotine dependence, unspecified, uncomplicated: Secondary | ICD-10-CM | POA: Insufficient documentation

## 2019-03-19 DIAGNOSIS — M79671 Pain in right foot: Secondary | ICD-10-CM | POA: Diagnosis present

## 2019-03-19 DIAGNOSIS — L03031 Cellulitis of right toe: Secondary | ICD-10-CM | POA: Insufficient documentation

## 2019-03-19 LAB — POCT I-STAT, CHEM 8
BUN: 8 mg/dL (ref 6–20)
Calcium, Ion: 1.16 mmol/L (ref 1.15–1.40)
Chloride: 105 mmol/L (ref 98–111)
Creatinine, Ser: 0.7 mg/dL (ref 0.61–1.24)
Glucose, Bld: 91 mg/dL (ref 70–99)
HCT: 42 % (ref 39.0–52.0)
Hemoglobin: 14.3 g/dL (ref 13.0–17.0)
Potassium: 3.9 mmol/L (ref 3.5–5.1)
Sodium: 137 mmol/L (ref 135–145)
TCO2: 20 mmol/L — ABNORMAL LOW (ref 22–32)

## 2019-03-19 MED ORDER — CLINDAMYCIN PHOSPHATE 600 MG/50ML IV SOLN
600.0000 mg | Freq: Once | INTRAVENOUS | Status: AC
  Administered 2019-03-19: 13:00:00 600 mg via INTRAVENOUS
  Filled 2019-03-19: qty 50

## 2019-03-19 MED ORDER — NAPROXEN 500 MG PO TABS: 500.0000 mg | ORAL_TABLET | Freq: Two times a day (BID) | ORAL | 0 refills | Status: DC

## 2019-03-19 MED ORDER — DOXYCYCLINE HYCLATE 100 MG PO CAPS: 100.0000 mg | ORAL_CAPSULE | Freq: Two times a day (BID) | ORAL | 0 refills | Status: DC

## 2019-03-19 NOTE — ED Triage Notes (Addendum)
Pt presents to ED with complaints of right foot pain and swelling. Pt denies injury. Pt states he has been wearing a steel toe boot for work and noticed the swelling after that for approx 8 days.

## 2019-03-19 NOTE — Discharge Instructions (Addendum)
Elevate your foot when possible, warm water soaks 2-3 times a day.  Be sure to dry your foot in between your toes thoroughly.  Keep the area bandaged for couple of days.  Take the antibiotic as directed, return to the ER for any worsening symptoms such as increasing pain,redness, fever or chills.

## 2019-03-19 NOTE — ED Provider Notes (Signed)
[facility] EMERGENCY DEPARTMENT Provider Note   CSN: [id number] Arrival date & time: 03/19/19  8242     History   Chief Complaint Chief Complaint  Patient presents with  . Foot Pain    HPI Maziah Keeling is a 58 y.o. male.     HPI   Jaedan Huttner is a 58 y.o. male who presents to the Emergency Department complaining of right foot pain and swelling with redness of his right fourth toe.  Symptoms initially began 8 days ago, but he noticed increasing pain and drainage along the area between the third and fourth toes that began yesterday.  He contributes his symptoms to wearing steel toed boots.  He denies known injury.  He states he has been using alcohol and Epson salt soaks to his foot without relief.  He describes a throbbing pain from his toe that radiates into his dorsal foot that is worse with weightbearing.  He denies fever, chills, numbness of his foot or toes, and pain proximal to his foot.  No history of diabetes.    Past Medical History:  Diagnosis Date  . Arthritis   . Chronic back pain   . Environmental allergies     Patient Active Problem List   Diagnosis Date Noted  . Alcohol abuse 09/10/2018  . Elevated liver enzymes 08/07/2018  . Hyperkalemia 08/07/2018  . Essential hypertension 08/07/2018  . PAD (peripheral artery disease) ([place]) 08/07/2018  . Tobacco use disorder 08/07/2018  . Felon of finger s/p I and D 02/15/18   . Claudication in peripheral vascular disease ([place]) 12/04/2017    Past Surgical History:  Procedure Laterality Date  . LOWER EXTREMITY ANGIOGRAPHY N/A 12/05/2017   Procedure: LOWER EXTREMITY ANGIOGRAPHY;  Surgeon: Adrian Prows, MD;  Location: [place] [facility];  Service: Cardiovascular;  Laterality: N/A;  . LUMBAR LAMINECTOMY/DECOMPRESSION MICRODISCECTOMY N/A 07/20/2016   Procedure: Right L4-5 Microdiscectomy;  Surgeon: Marybelle Killings, MD;  Location: [place];  Service: Orthopedics;  Laterality: N/A;  . NO PAST SURGERIES    . PERIPHERAL VASCULAR  ATHERECTOMY  12/05/2017   Procedure: PERIPHERAL VASCULAR ATHERECTOMY;  Surgeon: Adrian Prows, MD;  Location: [place] [facility];  Service: Cardiovascular;;  . PERIPHERAL VASCULAR INTERVENTION  12/05/2017   Procedure: PERIPHERAL VASCULAR INTERVENTION;  Surgeon: Adrian Prows, MD;  Location: [place] [facility];  Service: Cardiovascular;;      Home Medications    Prior to Admission medications   Medication Sig Start Date End Date Taking? Authorizing Provider  amLODipine (NORVASC) 5 MG tablet Take 1 tablet (5 mg total) by mouth daily. 08/07/18 11/05/18  Miquel Dunn, NP  aspirin EC 81 MG tablet Take 81 mg by mouth daily.    [provider]  lisinopril (ZESTRIL) 5 MG tablet Take 1 tablet (5 mg total) by mouth daily. 01/01/19 04/01/19  Miquel Dunn, NP  loratadine (CLARITIN) 10 MG tablet Take 10 mg by mouth daily.    [provider]  naproxen (NAPROSYN) 500 MG tablet Take 1 tablet (500 mg total) by mouth 2 (two) times daily with a meal. 01/13/19   Azaria Bartell, PA-C  rosuvastatin (CRESTOR) 5 MG tablet Take 1 tablet (5 mg total) by mouth daily. 09/25/18 12/24/18  Miquel Dunn, NP    Family History Family History  Problem Relation Age of Onset  . Diabetes Mother   . Hypertension Mother   . Heart disease Sister     Social History Social History   Tobacco Use  . Smoking status:  Current Every Day Smoker    Packs/day: 0.50  . Smokeless tobacco: Never Used  Substance Use Topics  . Alcohol use: Yes    Comment: 2 beers daily  . Drug use: No     Allergies   Patient has no known allergies.   Review of Systems Review of Systems  Constitutional: Negative for chills and fever.  Respiratory: Negative for shortness of breath.   Cardiovascular: Negative for chest pain.  Gastrointestinal: Negative for abdominal pain, nausea and vomiting.  Musculoskeletal: Positive for arthralgias (Pain of the right fourth toe and distal right foot) and joint swelling  (Swelling and redness of the right fourth toe).  Skin: Positive for color change and wound.  Neurological: Negative for dizziness and weakness.     Physical Exam Updated Vital Signs BP (!) 151/80 (BP Location: Right Arm)   Pulse 72   Temp 98.1 F (36.7 C) (Oral)   Resp 16   Ht 5\' 11"  (1.803 m)   Wt 97.5 kg   SpO2 99%   BMI 29.99 kg/m   Physical Exam Vitals signs and nursing note reviewed.  Constitutional:      Appearance: Normal appearance. He is not ill-appearing or toxic-appearing.  Neck:     Musculoskeletal: Normal range of motion.  Cardiovascular:     Rate and Rhythm: Normal rate and regular rhythm.     Pulses: Normal pulses.  Pulmonary:     Effort: Pulmonary effort is normal.     Breath sounds: Normal breath sounds.  Musculoskeletal:        General: Swelling and tenderness present.     Comments: Erythema and edema of the right fourth toe and distal right foot.  No lymphangitis.  There is a white discoloration of the distal fourth toe and webspace of the third and fourth toes with a small amount of purulent drainage noted.  No fluctuance.  See photo below  Skin:    General: Skin is warm.     Capillary Refill: Capillary refill takes less than 2 seconds.     Findings: Erythema present.  Neurological:     General: No focal deficit present.     Mental Status: He is alert.     Sensory: No sensory deficit.     Motor: No weakness.        Pt gave verbal consent for images to be stored into his medical record.    ED Treatments / Results  Labs (all labs ordered are listed, but only abnormal results are displayed) Labs Reviewed  POCT I-STAT, CHEM 8 - Abnormal; Notable for the following components:      Result Value   TCO2 20 (*)    All other components within normal limits    EKG None  Radiology Dg Foot Complete Right  Result Date: 03/19/2019 CLINICAL DATA:  Right foot pain without known injury. EXAM: RIGHT FOOT COMPLETE - 3+ VIEW COMPARISON:  None.  FINDINGS: There is no evidence of fracture or dislocation. No lytic destruction is noted. There is no evidence of arthropathy or other focal bone abnormality. Soft tissues are unremarkable. IMPRESSION: Negative. Electronically Signed   By: 03/21/2019 M.D.   On: 03/19/2019 12:08    Procedures Procedures (including critical care time)  Medications Ordered in ED Medications  clindamycin (CLEOCIN) IVPB 600 mg (has no administration in time range)     Initial Impression / Assessment and Plan / ED Course  I have reviewed the triage vital signs and the nursing notes.  Pertinent labs & imaging results that were available during my care of the patient were reviewed by me and considered in my medical decision making (see chart for details).      CBG 91  Patient with likely cellulitis originating from the right fourth toe.  There may also be a component of tinea as well.  Neurovascularly intact.  No symptoms proximal to the foot.  He is well-appearing and nontoxic.  Vital signs reviewed. Given IV clinda here and rx written for doxycycline.  He agrees to discussed home care and recheck in 2-3 days.      Final Clinical Impressions(s) / ED Diagnoses   Final diagnoses:  Cellulitis of toe of right foot    ED Discharge Orders    None       Pauline Ausriplett, Jordany [place], PA-C 03/20/19 0844    Donnetta Hutchingook, Brian, MD 03/21/19 1010

## 2019-03-19 NOTE — ED Notes (Signed)
Returned to waiting room from x-ray

## 2019-04-05 ENCOUNTER — Ambulatory Visit: Payer: Medicaid Other | Admitting: Cardiology

## 2019-04-17 ENCOUNTER — Ambulatory Visit (INDEPENDENT_AMBULATORY_CARE_PROVIDER_SITE_OTHER): Payer: Medicaid Other | Admitting: Cardiology

## 2019-04-17 ENCOUNTER — Encounter: Payer: Self-pay | Admitting: Cardiology

## 2019-04-17 ENCOUNTER — Other Ambulatory Visit: Payer: Self-pay

## 2019-04-17 VITALS — BP 136/72 | HR 89 | Ht 71.0 in | Wt 237.9 lb

## 2019-04-17 DIAGNOSIS — R748 Abnormal levels of other serum enzymes: Secondary | ICD-10-CM | POA: Diagnosis not present

## 2019-04-17 DIAGNOSIS — F17200 Nicotine dependence, unspecified, uncomplicated: Secondary | ICD-10-CM | POA: Diagnosis not present

## 2019-04-17 DIAGNOSIS — I739 Peripheral vascular disease, unspecified: Secondary | ICD-10-CM

## 2019-04-17 DIAGNOSIS — I10 Essential (primary) hypertension: Secondary | ICD-10-CM

## 2019-04-17 DIAGNOSIS — Z789 Other specified health status: Secondary | ICD-10-CM

## 2019-04-17 DIAGNOSIS — Z7289 Other problems related to lifestyle: Secondary | ICD-10-CM

## 2019-04-17 MED ORDER — VARENICLINE TARTRATE 1 MG PO TABS: 1.0000 mg | ORAL_TABLET | Freq: Two times a day (BID) | ORAL | 2 refills | Status: DC

## 2019-04-17 MED ORDER — CHANTIX STARTING MONTH PAK 0.5 MG X 11 & 1 MG X 42 PO TABS: ORAL_TABLET | ORAL | 0 refills | Status: DC

## 2019-04-17 NOTE — Progress Notes (Signed)
Primary Physician/Referring:  Patient, No Pcp Per  Patient ID: Patrick Aguirre, male    DOB: Aug 20, 1960, 58 y.o.   MRN: [id number]  Chief Complaint  Patient presents with  . PAD  . Hypertension   HPI:    Patrick Aguirre  is a 58 y.o. AAM with chronic tobacco use, essential hypertension, elevated LFTs felt to be due to statins and also hypokalemia and unable to tolerate high-dose ACE inhibitor due to hyperkalemia and chronic back pain s/p L4-5 microdisectomy in Feb 2018. Patient is S/P right common iliac artery stenting on 12/05/2017, with improvement in symptoms of right hip claudication.  He still has pseudoclaudication/neurogenic claudication.   This is a 21-month office visit,no chest pain or shortness of breath.  His LFTs have improved since reducing the dose of Crestor, it was also felt that alcohol could be contributing along with obesity and fatty liver to his abnormal liver enzymes.  GI consultation was also obtained. He reports smoking half a pack per day. Interested in chantix to help him quit. Continues to drink 1-2 beers some days.   Patient was also evaluated in the emergency room on 01/13/2019 with left hip pain and frequent falls, alcohol level found to be high.  LFTs have improved with minimal abnormality in ALT.  Past Medical History:  Diagnosis Date  . Arthritis   . Chronic back pain   . Environmental allergies    Past Surgical History:  Procedure Laterality Date  . LOWER EXTREMITY ANGIOGRAPHY N/A 12/05/2017   Procedure: LOWER EXTREMITY ANGIOGRAPHY;  Surgeon: Yates Decamp, MD;  Location: [facility] INVASIVE CV [facility];  Service: Cardiovascular;  Laterality: N/A;  . LUMBAR LAMINECTOMY/DECOMPRESSION MICRODISCECTOMY N/A 07/20/2016   Procedure: Right L4-5 Microdiscectomy;  Surgeon: Eldred Manges, MD;  Location: [facility] OR;  Service: Orthopedics;  Laterality: N/A;  . NO PAST SURGERIES    . PERIPHERAL VASCULAR ATHERECTOMY  12/05/2017   Procedure: PERIPHERAL VASCULAR ATHERECTOMY;  Surgeon: Yates Decamp,  MD;  [facility] CV [facility];  Service: Cardiovascular;;  . PERIPHERAL VASCULAR INTERVENTION  12/05/2017   Procedure: PERIPHERAL VASCULAR INTERVENTION;  Surgeon: Yates Decamp, MD;  Location: [facility] INVAS[facility] CV LAB;  Service: Cardiovascular;;   Social History   Socioeconomic History  . Marital status: Married    Spouse name: Not on file  . Number of children: 1  . Years of education: Not on file  . Highest education level: Not on file  Occupational History  . Not on file  Social Needs  . Financial resource strain: Not on file  . Food insecurity    Worry: Not on file    Inability: Not on file  . Transportation needs    Medical: Not on file    Non-medical: Not on file  Tobacco Use  . Smoking status: Current Every Day Smoker    Packs/day: 0.50  . Smokeless tobacco: Never Used  Substance and Sexual Activity  . Alcohol use: Yes    Comment: 2 beers daily  . Drug use: No  . Sexual activity: Not on file  Lifestyle  . Physical activity    Days per week: Not on file    Minutes per session: Not on file  . Stress: Not on file  Relationships  . Social Musician on phone: Not on file    Gets together: Not on file    Attends religious service: Not on file    Active member of club or organization: Not on file    Attends meetings  of clubs or organizations: Not on file    Relationship status: Not on file  . Intimate partner violence    Fear of current or ex partner: Not on file    Emotionally abused: Not on file    Physically abused: Not on file    Forced sexual activity: Not on file  Other Topics Concern  . Not on file  Social History Narrative  . Not on file   ROS  Review of Systems  Constitution: Negative for decreased appetite, malaise/fatigue, weight gain and weight loss.  Eyes: Negative for visual disturbance.  Cardiovascular: Positive for claudication (bilateral calves; right worse than left). Negative for chest pain, dyspnea on exertion, leg swelling,  orthopnea, palpitations and syncope.  Respiratory: Negative for hemoptysis and wheezing.   Endocrine: Negative for cold intolerance and heat intolerance.  Hematologic/Lymphatic: Does not bruise/bleed easily.  Skin: Negative for nail changes.  Musculoskeletal: Positive for back pain. Negative for muscle weakness and myalgias.  Gastrointestinal: Negative for abdominal pain, change in bowel habit, nausea and vomiting.  Neurological: Negative for difficulty with concentration, dizziness, focal weakness and headaches.  Psychiatric/Behavioral: Negative for altered mental status and suicidal ideas.  All other systems reviewed and are negative.  Objective   Vitals with BMI 04/17/2019 03/19/2019 03/19/2019  Height  -   Weight 237 lbs 14 oz - 215 lbs  BMI 33.2 - 30  Systolic 142 125 -  Diastolic 72 85 -  Pulse 89 80 -     Physical Exam  Constitutional: He appears well-developed and well-nourished. No distress.  Mildly Obese  HENT:  Head: Atraumatic.  Eyes: Conjunctivae are normal.  Neck: Neck supple. No thyromegaly present.  Cardiovascular: Normal rate, regular rhythm and normal heart sounds. Exam reveals no gallop.  No murmur heard. Pulses:      Carotid pulses are 2+ on the right side with bruit and 2+ on the left side with bruit.      Femoral pulses are 1+ on the right side and 1+ on the left side.      Popliteal pulses are 0 on the right side and 2+ on the left side.       Dorsalis pedis pulses are 0 on the right side and 0 on the left side.       Posterior tibial pulses are 2+ on the right side and 2+ on the left side.  No JVD, No legg edema. Normal capillary fill present  Pulmonary/Chest: Effort normal and breath sounds normal.  Abdominal: Soft. Bowel sounds are normal.  Musculoskeletal: Normal range of motion.  Neurological: He is alert.  Skin: Skin is warm and dry.  Psychiatric: He has a normal mood and affect.  Vitals reviewed.  Laboratory examination:    Recent Labs    07/17/18 1026 09/17/18 0805 12/05/18 1521 01/13/19 1126 03/19/19 1421  NA 136 137  --  130* 137  K 5.6* 5.0  --  4.0 3.9  CL 100 104  --  100 105  CO2 18* 20  --  21*  --   GLUCOSE 91 83  --  100* 91  BUN 11 7* CREATININE 1.02 0.79 0.89 0.71 0.70  CALCIUM 10.1 9.0  --  8.6*  --   GFRNONAA 80 98 93 >60  --   GFRAA 93 113 108 >60  --    CrCl cannot be calculated (Patient's most recent lab result is older than the maximum 21 days allowed.).  CMP Latest  Ref Rng & Units 03/19/2019 01/13/2019 12/05/2018  Glucose 70 - 99 mg/dL 91 130(Q100(H) -  BUN 6 - 20 mg/dL 8 6 10   Creatinine 0.61 - 1.24 mg/dL 6.570.70 8.460.71 9.620.89  Sodium 135 - 145 mmol/L 137 130(L) -  Potassium 3.5 - 5.1 mmol/L 3.9 4.0 -  Chloride 98 - 111 mmol/L 105 100 -  CO2 22 - 32 mmol/L - 21(L) -  Calcium 8.9 - 10.3 mg/dL - 8.6(L) -  Total Protein 6.5 - 8.1 g/dL - 8.1 9.5(M8.6(H)  Total Bilirubin 0.3 - 1.2 mg/dL - 0.4 1.0  Alkaline Phos 38 - 126 U/L - 59 69  AST 15 - 41 U/L - 55(H) 83(H)  ALT 0 - 44 U/L - 30 49   CBC Latest Ref Rng & Units 03/19/2019 01/13/2019 07/17/2018  WBC 4.0 - 10.5 K/uL - 8.5 6.5  Hemoglobin 13.0 - 17.0 g/dL 84.114.3 12.6(L) 12.4(L)  Hematocrit 39.0 - 52.0 % 42.0 39.3 36.4(L)  Platelets 150 - 400 K/uL - 366 254   Lipid Panel     Component Value Date/Time   CHOL 158 07/17/2018 1026   TRIG 48 07/17/2018 1026   HDL 83 07/17/2018 1026   LDLCALC 65 07/17/2018 1026   HEMOGLOBIN A1C No results found for: HGBA1C, MPG TSH No results for input(s): TSH in the last 8760 hours. Medications and allergies  No Known Allergies   Current Outpatient Medications  Medication Instructions  . amLODipine (NORVASC) 5 mg, Oral, Daily  . aspirin EC 81 mg, Oral, Daily  . clopidogrel (PLAVIX) 75 mg, Oral, Daily  . lisinopril (ZESTRIL) 5 mg, Oral, Daily  . loratadine (CLARITIN) 10 mg, Oral, Daily  . rosuvastatin (CRESTOR) 5 mg, Oral, Daily  . varenicline (CHANTIX CONTINUING MONTH PAK) 1 mg, Oral, 2 times  daily  . varenicline (CHANTIX STARTING MONTH PAK) 0.5 MG X 11 & 1 MG X 42 tablet Take one 0.5 mg tablet by mouth once daily for 3 days, then increase to one 0.5 mg tablet twice daily for 4 days, then increase to one 1 mg tablet twice daily.    Radiology:   CT of the abdomen 01/07/2019: 1. Mild hepatomegaly. No evidence of liver mass or other hepatic abnormality. 2. Cholelithiasis. No radiographic evidence of cholecystitis. 3. Aortic Atherosclerosis (ICD10-I70.0).  Ultrasound of the abdomen 12/24/2018: Normal.  Cardiac Studies:   Lexiscan myoview stress test 09/18/2017: 1. The resting electrocardiogram demonstrated normal sinus rhythm, poor R progression, normal resting conduction, no resting arrhythmias and non-specific T changes. Stress EKG is non-diagnostic for ischemia as it a pharmacologic stress using Lexiscan. The patient developed significant symptoms which included Flushing. 2. LV is dilated both at rest and stress images. The LV end diastolic volume was 148 mL. Stress and rest SPECT images demonstrate homogeneous tracer distribution throughout the myocardium. Gated SPECT imaging reveals normal myocardial thickening and wall motion. The left ventricular ejection fraction was low normal (53%). This is a low risk study.  Carotid artery duplex 04/09/2018: No hemodynamically significant arterial disease in the internal carotid artery bilaterally. Very mild heterogeneous plaque noted bilaterally. Antegrade right vertebral artery flow. Antegrade left vertebral artery flow.  Peripheral arteriogram 12/05/2017: CSI atherectomy followed by stenting of right CIA with 10.0 x 39 mm Viabhan VBX, 100% to 0%. Left CIA 50% stenosis without gradient.  ABI 01/02/2018: This exam reveals mildly decreased perfusion of the bilateral lower extremity, RABI 0.91 and LABI 0.88. Mild plaque evident in lower extremity. However triphasic waveform (Normal) noted. Clinical correlation recommended. Compared to  11/21/2017, right ABI has improved from 0.52.  Abdominal aortic duplex 03/06/2018: No AAA observed. The maximum aorta diameter is 2.45 cm (prox). No evidence of atherosclerotic plaque. Normal flow velocities noted in the aorta and iliac vessels. There is diffuse calcific plaque. Right iliac artery stent appears patent.  Assessment     ICD-10-CM   1. Essential hypertension  I10   2. PAD (peripheral artery disease) (HCC)  I73.9 varenicline (CHANTIX CONTINUING MONTH PAK) 1 MG tablet    varenicline (CHANTIX STARTING MONTH PAK) 0.5 MG X 11 & 1 MG X 42 tablet  3. Elevated liver enzymes  R74.8   4. Tobacco use disorder  F17.200 varenicline (CHANTIX CONTINUING MONTH PAK) 1 MG tablet    varenicline (CHANTIX STARTING MONTH PAK) 0.5 MG X 11 & 1 MG X 42 tablet  5. Alcohol use  Z72.89     EKG 03/21/2018: Sinus rhythm 79 bpm.  Normal axis.  Borderline first-degree AV block.  Old anteroseptal infarct.  Recommendations:   Meds ordered this encounter  Medications  . varenicline (CHANTIX CONTINUING MONTH PAK) 1 MG tablet    Sig: Take 1 tablet (1 mg total) by mouth 2 (two) times daily.    Dispense:  60 tablet    Refill:  2  . varenicline (CHANTIX STARTING MONTH PAK) 0.5 MG X 11 & 1 MG X 42 tablet    Sig: Take one 0.5 mg tablet by mouth once daily for 3 days, then increase to one 0.5 mg tablet twice daily for 4 days, then increase to one 1 mg tablet twice daily.    Dispense:  53 tablet    Refill:  0    Patrick Aguirre  is a 58 y.o. AAM with chronic tobacco use, essential hypertension, elevated LFTs felt to be due to statins and also hypokalemia and unable to tolerate high-dose ACE inhibitor due to hyperkalemia and chronic back pain s/p L4-5 microdisectomy in Feb 2018. Patient is S/P right common iliac artery stenting on 12/05/2017, with improvement in symptoms of right hip claudication.  He still has pseudoclaudication/neurogenic claudication.   This is a 76-month office visit,no chest pain or shortness of  breath.  His LFTs have improved since reducing the dose of Crestor, it was also felt that alcohol could be contributing along with obesity and fatty liver to his abnormal liver enzymes.  Patient is here for 3 month follow-up of claudication, he has had multiple emergency room visits and also physician visit, he still continues to have right leg claudication worse in the left, especially the Cysts worse.  He also has mild bilateral hip pain.  Physical examination is clearly abnormal, it reduced pulse especially the right leg.    I'll set him up for abdominal aortic duplex to evaluate for patency of right iliac artery stent and also perform lower extremity arterial duplex in view of possible need for revascularization.  Smoking cessation was discussed extensively, he appears to be motivated to quit, I have prescribed him Chantix.  He'll start with start a pack 1st.  With regard to alcohol consumption, he is now reduced alcohol to 2 cans of beer a day for the past 4 months.  He feels well overall.  I'd like to see him back in 3 months for follow-up of smoking cessation and also PAD.  With regard to hypertension, blood pressure is well controlled, continue present medications.  Adrian Prows, MD, [facility] 04/17/2019, 9:16 AM [facility] [place] Pager: [phone] Office: [phone] If no answer Cell [phone]

## 2019-04-18 ENCOUNTER — Encounter: Payer: Self-pay | Admitting: Cardiology

## 2019-05-02 IMAGING — US US ABDOMEN LIMITED
1 series · 14 of 25 positions shown · non-contrast
Comparison: None.

CLINICAL DATA: Elevated liver enzymes

EXAM:
ULTRASOUND ABDOMEN LIMITED RIGHT UPPER QUADRANT

[Series 1: us abdomen limited · 0.23mm/px · 14 of 56 slices shown]
[im 1/56]
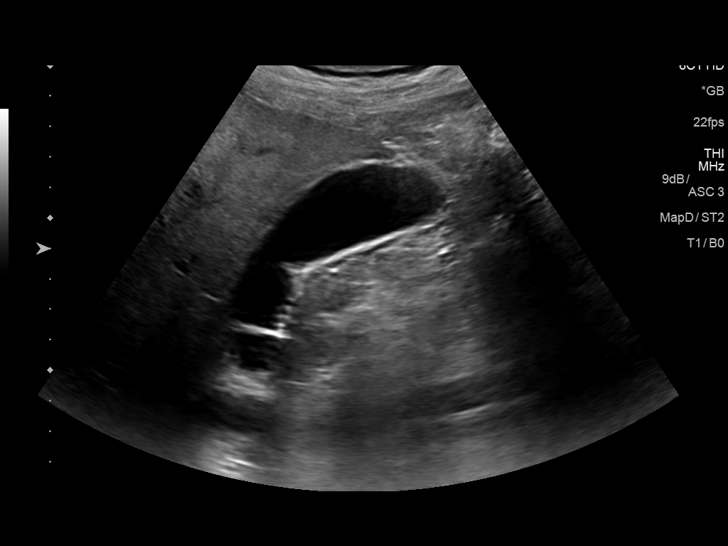
[im 5/56]
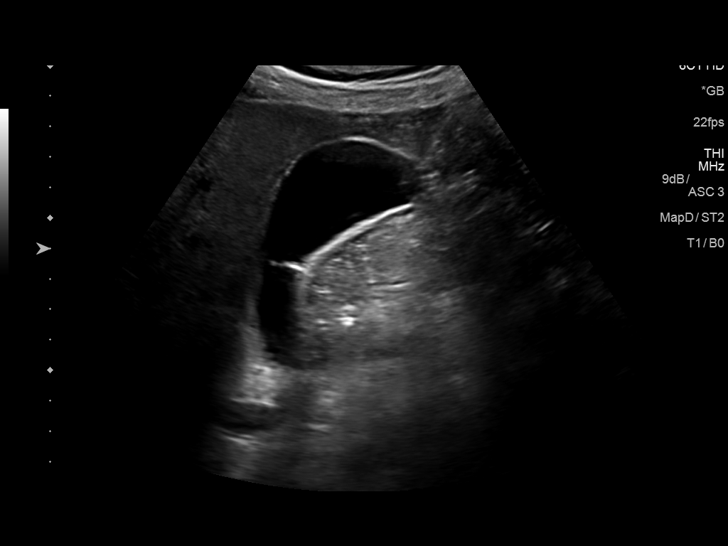
[im 10/56]
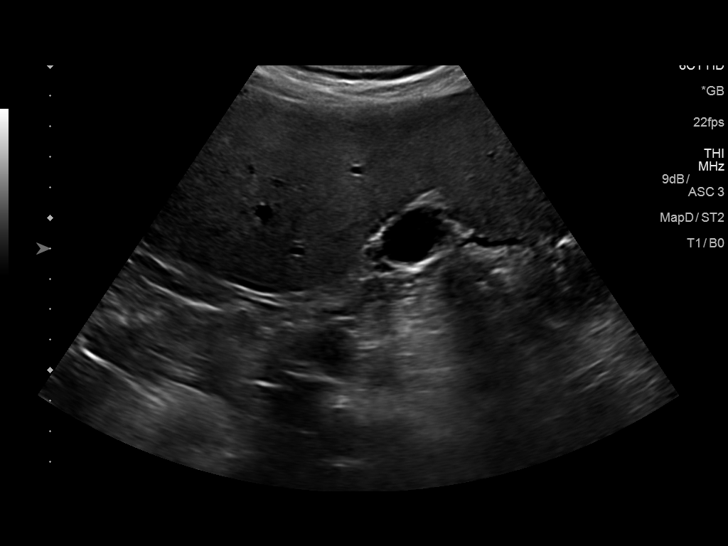
[im 14/56]
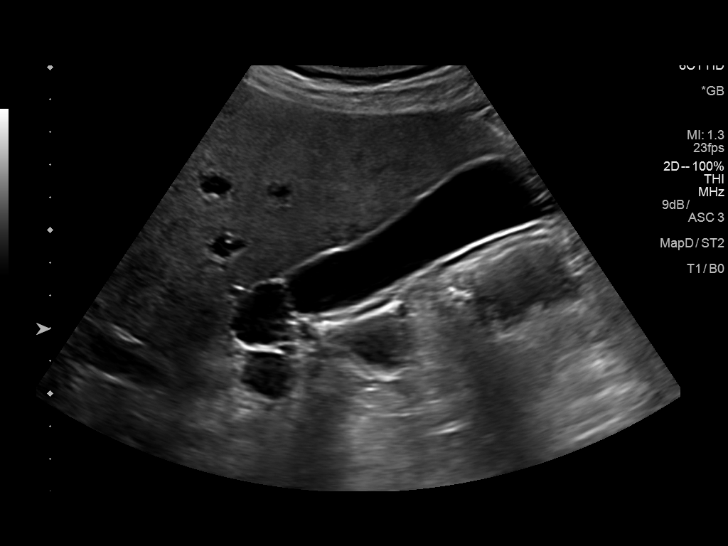
[im 19/56]
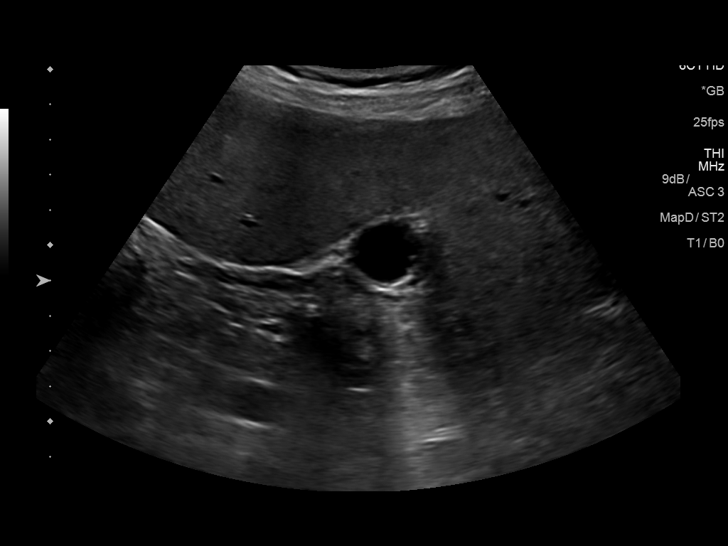
[im 21/56]
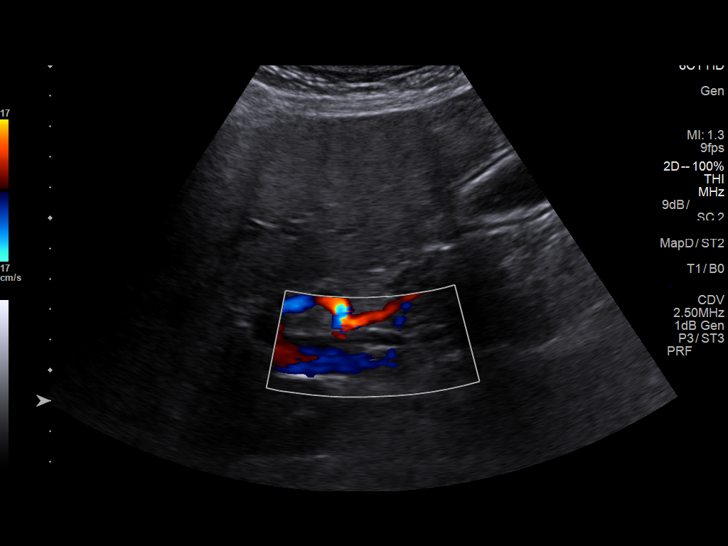
[im 26/56]
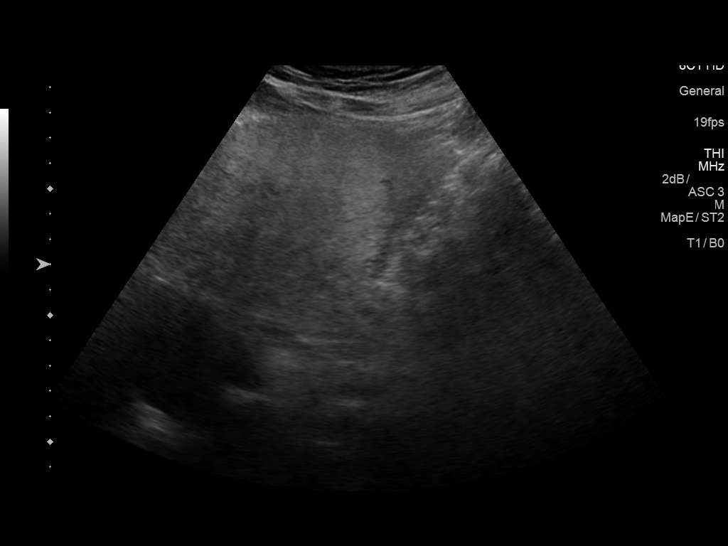
[im 30/56]
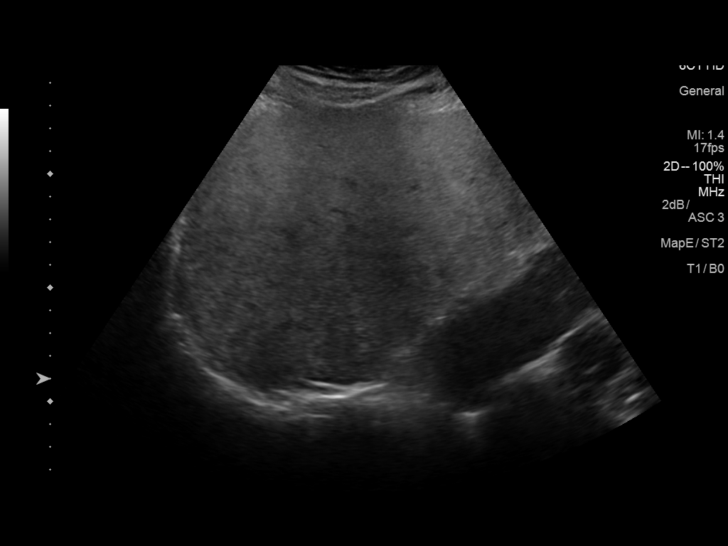
[im 35/56]
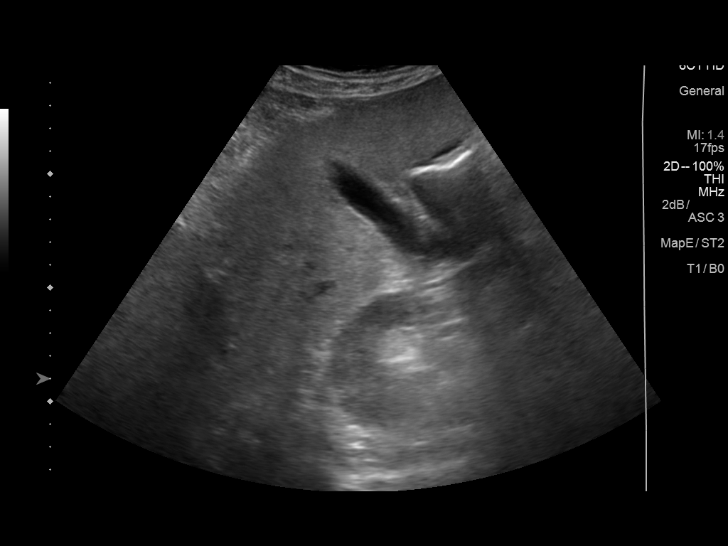
[im 37/56]
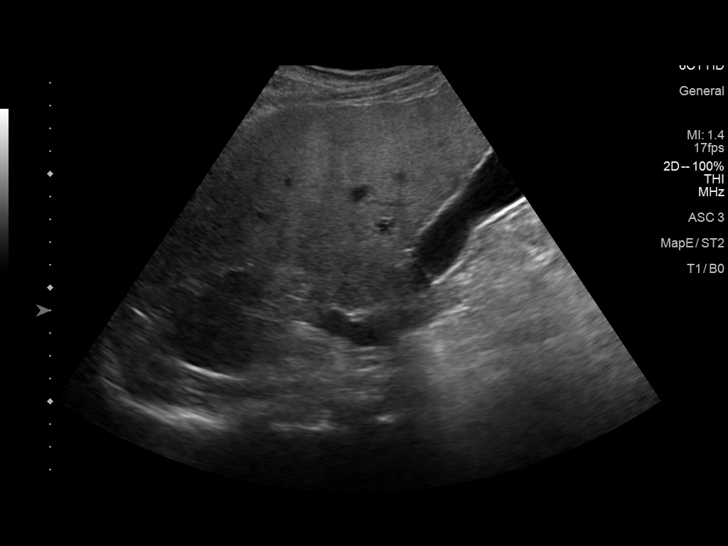
[im 42/56]
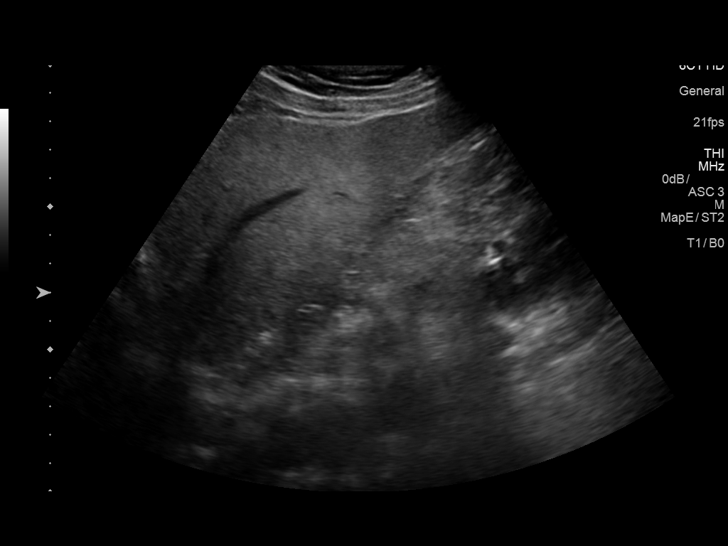
[im 46/56]
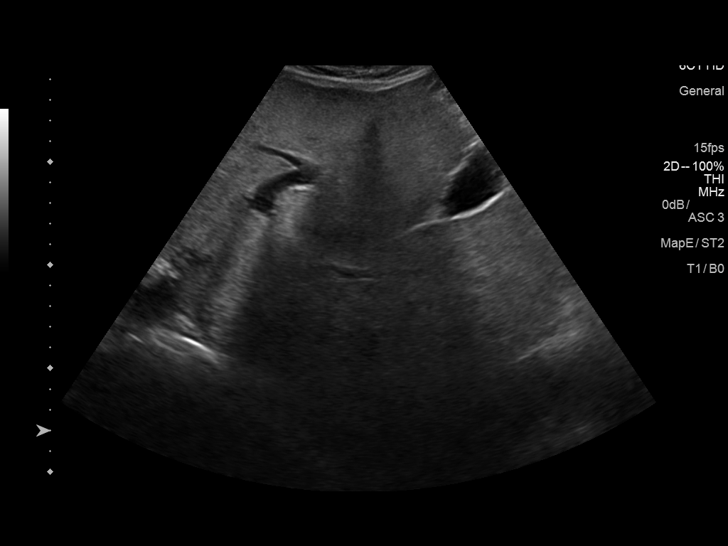
[im 51/56]
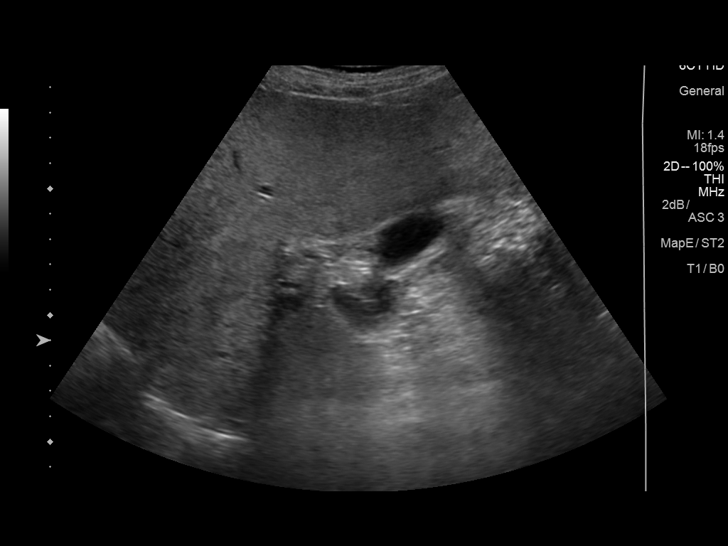
[im 56/56]
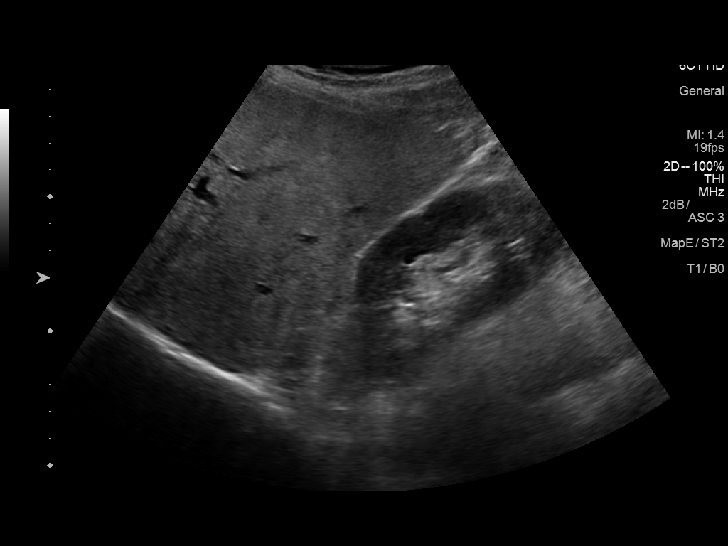

[14 of 25 positions shown; findings below may reference images not displayed]

FINDINGS: Gallbladder:

No gallstones or wall thickening visualized. No sonographic Murphy
sign noted by sonographer.

Common bile duct:

Diameter: 5 mm

Liver:

Liver is diffusely echogenic indicating fatty infiltration. No focal
mass or lesion is identified within the liver. Portal vein is patent
on color Doppler imaging with normal direction of blood flow towards
the liver.
IMPRESSION: 1. Fatty infiltration of the liver.
2. No acute or suspicious findings.

## 2019-05-08 ENCOUNTER — Other Ambulatory Visit: Payer: Medicaid Other

## 2019-05-14 ENCOUNTER — Other Ambulatory Visit: Payer: Medicaid Other

## 2019-05-22 ENCOUNTER — Ambulatory Visit: Payer: Medicaid Other

## 2019-05-22 ENCOUNTER — Ambulatory Visit (INDEPENDENT_AMBULATORY_CARE_PROVIDER_SITE_OTHER): Payer: Medicaid Other

## 2019-05-22 ENCOUNTER — Other Ambulatory Visit: Payer: Self-pay

## 2019-05-22 DIAGNOSIS — I739 Peripheral vascular disease, unspecified: Secondary | ICD-10-CM

## 2019-05-29 NOTE — Progress Notes (Signed)
Phone number disconnected

## 2019-06-03 ENCOUNTER — Emergency Department (HOSPITAL_COMMUNITY)
Admission: EM | Admit: 2019-06-03 | Discharge: 2019-06-04 | Disposition: A | Discharge status: Home / Self Care | Payer: Medicaid Other | Attending: Emergency Medicine | Admitting: Emergency Medicine

## 2019-06-03 ENCOUNTER — Ambulatory Visit (INDEPENDENT_AMBULATORY_CARE_PROVIDER_SITE_OTHER): Payer: Medicaid Other

## 2019-06-03 ENCOUNTER — Other Ambulatory Visit: Payer: Self-pay

## 2019-06-03 ENCOUNTER — Encounter (HOSPITAL_COMMUNITY): Payer: Self-pay | Admitting: Emergency Medicine

## 2019-06-03 ENCOUNTER — Other Ambulatory Visit: Payer: Medicaid Other

## 2019-06-03 DIAGNOSIS — Z7902 Long term (current) use of antithrombotics/antiplatelets: Secondary | ICD-10-CM | POA: Diagnosis not present

## 2019-06-03 DIAGNOSIS — E669 Obesity, unspecified: Secondary | ICD-10-CM | POA: Insufficient documentation

## 2019-06-03 DIAGNOSIS — F17210 Nicotine dependence, cigarettes, uncomplicated: Secondary | ICD-10-CM | POA: Diagnosis not present

## 2019-06-03 DIAGNOSIS — I739 Peripheral vascular disease, unspecified: Secondary | ICD-10-CM | POA: Diagnosis not present

## 2019-06-03 DIAGNOSIS — Z6831 Body mass index (BMI) 31.0-31.9, adult: Secondary | ICD-10-CM | POA: Diagnosis not present

## 2019-06-03 DIAGNOSIS — Z7982 Long term (current) use of aspirin: Secondary | ICD-10-CM | POA: Diagnosis not present

## 2019-06-03 DIAGNOSIS — Z79899 Other long term (current) drug therapy: Secondary | ICD-10-CM | POA: Diagnosis not present

## 2019-06-03 DIAGNOSIS — R040 Epistaxis: Secondary | ICD-10-CM | POA: Insufficient documentation

## 2019-06-03 DIAGNOSIS — I700 Atherosclerosis of aorta: Secondary | ICD-10-CM

## 2019-06-03 DIAGNOSIS — I10 Essential (primary) hypertension: Secondary | ICD-10-CM | POA: Insufficient documentation

## 2019-06-03 MED ORDER — TRANEXAMIC ACID 1000 MG/10ML IV SOLN
500.0000 mg | Freq: Once | INTRAVENOUS | Status: AC
  Administered 2019-06-04: 500 mg via TOPICAL
  Filled 2019-06-03: qty 10

## 2019-06-03 NOTE — ED Triage Notes (Signed)
Pt c/o nosebleed x 1.5 hours, bleeding controlled at this time.

## 2019-06-03 NOTE — ED Provider Notes (Signed)
[facility] EMERGENCY DEPARTMENT Provider Note   CSN: [id number] Arrival date & time: 06/03/19  2145   History Chief Complaint  Patient presents with  . Epistaxis    Patrick Aguirre is a 59 y.o. male.  The history is provided by the patient.  Epistaxis He has a history of hypertension and peripheral vascular disease and comes in because of nosebleed.  He states his nose was bleeding yesterday and stopped and then started again today.  He thinks it is bleeding from both sides.  He does take aspirin but is not on any anticoagulants and is not on any other antiplatelet agents.  He denies prior episodes of nosebleeds.  He denies any nasal trauma.  Past Medical History:  Diagnosis Date  . Arthritis   . Chronic back pain   . Environmental allergies     Patient Active Problem List   Diagnosis Date Noted  . Alcohol abuse 09/10/2018  . Elevated liver enzymes 08/07/2018  . Hyperkalemia 08/07/2018  . Essential hypertension 08/07/2018  . PAD (peripheral artery disease) (HCC) 08/07/2018  . Tobacco use disorder 08/07/2018  . Felon of finger s/p I and D 02/15/18   . Claudication in peripheral vascular disease (HCC) 12/04/2017    Past Surgical History:  Procedure Laterality Date  . LOWER EXTREMITY ANGIOGRAPHY N/A 12/05/2017   Procedure: LOWER EXTREMITY ANGIOGRAPHY;  Surgeon: Yates Decamp, MD;  Location: [facility] INVASIVE CV LAB;  Service: Cardiovascular;  Laterality: N/A;  . LUMBAR LAMINECTOMY/DECOMPRESSION MICRODISCECTOMY N/A 07/20/2016   Procedure: Right L4-5 Microdiscectomy;  Surgeon: Eldred Manges, MD;  Location: [facility] OR;  Service: Orthopedics;  Laterality: N/A;  . NO PAST SURGERIES    . PERIPHERAL VASCULAR ATHERECTOMY  12/05/2017   Procedure: PERIPHERAL VASCULAR ATHERECTOMY;  Surgeon: Yates Decamp, MD;  Location: [facility] INVASIVE CV LAB;  Service: Cardiovascular;;  . PERIPHERAL VASCULAR INTERVENTION  12/05/2017   Procedure: PERIPHERAL VASCULAR INTERVENTION;  Surgeon: Yates Decamp, MD;  Location: [facility] INVASIVE  CV LAB;  Service: Cardiovascular;;       Family History  Problem Relation Age of Onset  . Diabetes Mother   . Hypertension Mother   . Heart disease Sister     Social History   Tobacco Use  . Smoking status: Current Every Day Smoker    Packs/day: 0.50  . Smokeless tobacco: Never Used  Substance Use Topics  . Alcohol use: Yes    Comment: 2 beers daily  . Drug use: No    Home Medications Prior to Admission medications   Medication Sig Start Date End Date Taking? Authorizing Provider  amLODipine (NORVASC) 5 MG tablet Take 1 tablet (5 mg total) by mouth daily. 08/07/18 04/17/19  Toniann Fail, NP  aspirin EC 81 MG tablet Take 81 mg by mouth daily.    [provider]  clopidogrel (PLAVIX) 75 MG tablet Take 75 mg by mouth daily.    [provider]  lisinopril (ZESTRIL) 5 MG tablet Take 1 tablet (5 mg total) by mouth daily. 01/01/19 04/17/19  Toniann Fail, NP  loratadine (CLARITIN) 10 MG tablet Take 10 mg by mouth daily.    [provider]  rosuvastatin (CRESTOR) 5 MG tablet Take 1 tablet (5 mg total) by mouth daily. 09/25/18 04/17/19  Toniann Fail, NP  varenicline (CHANTIX CONTINUING MONTH PAK) 1 MG tablet Take 1 tablet (1 mg total) by mouth 2 (two) times daily. 04/17/19   Yates Decamp, MD  varenicline (CHANTIX STARTING MONTH PAK) 0.5 MG X 11 & 1 MG X  42 tablet Take one 0.5 mg tablet by mouth once daily for 3 days, then increase to one 0.5 mg tablet twice daily for 4 days, then increase to one 1 mg tablet twice daily. 04/17/19   Adrian Prows, MD    Allergies    Patient has no known allergies.  Review of Systems   Review of Systems  HENT: Positive for nosebleeds.   All other systems reviewed and are negative.   Physical Exam Updated Vital Signs BP 139/66   Pulse 84   Temp (!) 97 F (36.1 C) (Tympanic)   Resp 17   Ht 5\' 11"  (1.803 m)   Wt 104.3 kg   SpO2 95%   BMI 32.08 kg/m   Physical Exam Vitals and nursing note  reviewed.   59 year old male, resting comfortably and in no acute distress. Vital signs are normal. Oxygen saturation is 95%, which is normal. Head is normocephalic and atraumatic. PERRLA, EOMI. Oropharynx is clear.  There is no active nosebleed currently.  Site of recent bleeding is noted on the left side of the nasal septum.  Questionable area of recent bleeding noted on the right side of the nasal septum. Neck is nontender and supple without adenopathy or JVD. Back is nontender and there is no CVA tenderness. Lungs are clear without rales, wheezes, or rhonchi. Chest is nontender. Heart has regular rate and rhythm without murmur. Abdomen is soft, flat, nontender without masses or hepatosplenomegaly and peristalsis is normoactive. Extremities have no cyanosis or edema, full range of motion is present. Skin is warm and dry without rash. Neurologic: Mental status is normal, cranial nerves are intact, there are no motor or sensory deficits.  ED Results / Procedures / Treatments    Procedures .Epistaxis Management  Date/Time: 06/04/2019 12:55 AM Performed by: Delora Fuel, MD Authorized by: Delora Fuel, MD   Consent:    Consent obtained:  Verbal   Consent given by:  Patient   Risks discussed:  Bleeding, infection and pain   Alternatives discussed:  Alternative treatment Anesthesia (see MAR for exact dosages):    Anesthesia method:  None Procedure details:    Treatment site:  L anterior   Treatment method:  Anterior pack (Cotton soaked with tranexamic acid)   Treatment complexity:  Limited   Treatment episode: initial   Post-procedure details:    Assessment:  Bleeding stopped   Patient tolerance of procedure:  Tolerated well, no immediate complications    Medications Ordered in ED Medications  tranexamic acid (CYKLOKAPRON) injection 500 mg (500 mg Topical Given by Other 06/04/19 0020)    ED Course  I have reviewed the triage vital signs and the nursing notes.   MDM  Rules/Calculators/A&P Epistaxis.  He will be treated with topical tranexamic acid.  Old records are reviewed, and he has no relevant past visits.  Following topical tranexamic acid, bleeding had stopped.  He was observed in the ED for 45 minutes with no recurrence of bleeding.  He was felt to be safe for discharge at that point.  Final Clinical Impression(s) / ED Diagnoses Final diagnoses:  Left-sided epistaxis    Rx / DC Orders ED Discharge Orders    None       Delora Fuel, MD 96/78/93 [phone]

## 2019-06-04 ENCOUNTER — Other Ambulatory Visit: Payer: Self-pay

## 2019-06-04 ENCOUNTER — Encounter (HOSPITAL_COMMUNITY): Payer: Self-pay | Admitting: Emergency Medicine

## 2019-06-04 ENCOUNTER — Emergency Department (HOSPITAL_COMMUNITY)
Admission: EM | Admit: 2019-06-04 | Discharge: 2019-06-04 | Disposition: A | Discharge status: Home / Self Care | Payer: Medicaid Other | Source: Home / Self Care | Attending: Emergency Medicine | Admitting: Emergency Medicine

## 2019-06-04 DIAGNOSIS — R040 Epistaxis: Secondary | ICD-10-CM | POA: Insufficient documentation

## 2019-06-04 DIAGNOSIS — E669 Obesity, unspecified: Secondary | ICD-10-CM | POA: Insufficient documentation

## 2019-06-04 DIAGNOSIS — I10 Essential (primary) hypertension: Secondary | ICD-10-CM | POA: Insufficient documentation

## 2019-06-04 DIAGNOSIS — F17210 Nicotine dependence, cigarettes, uncomplicated: Secondary | ICD-10-CM | POA: Insufficient documentation

## 2019-06-04 DIAGNOSIS — Z6831 Body mass index (BMI) 31.0-31.9, adult: Secondary | ICD-10-CM | POA: Insufficient documentation

## 2019-06-04 DIAGNOSIS — Z7982 Long term (current) use of aspirin: Secondary | ICD-10-CM | POA: Insufficient documentation

## 2019-06-04 DIAGNOSIS — Z79899 Other long term (current) drug therapy: Secondary | ICD-10-CM | POA: Insufficient documentation

## 2019-06-04 DIAGNOSIS — Z7902 Long term (current) use of antithrombotics/antiplatelets: Secondary | ICD-10-CM | POA: Insufficient documentation

## 2019-06-04 LAB — COMPREHENSIVE METABOLIC PANEL
ALT: 39 U/L (ref 0–44)
AST: 50 U/L — ABNORMAL HIGH (ref 15–41)
Albumin: 4 g/dL (ref 3.5–5.0)
Alkaline Phosphatase: 69 U/L (ref 38–126)
Anion gap: 11 (ref 5–15)
BUN: 15 mg/dL (ref 6–20)
CO2: 21 mmol/L — ABNORMAL LOW (ref 22–32)
Calcium: 8.8 mg/dL — ABNORMAL LOW (ref 8.9–10.3)
Chloride: 98 mmol/L (ref 98–111)
Creatinine, Ser: 0.95 mg/dL (ref 0.61–1.24)
GFR calc Af Amer: >60 mL/min (ref >60)
GFR calc non Af Amer: >60 mL/min (ref >60)
Glucose, Bld: 101 mg/dL — ABNORMAL HIGH (ref 70–99)
Potassium: 3.9 mmol/L (ref 3.5–5.1)
Sodium: 130 mmol/L — ABNORMAL LOW (ref 135–145)
Total Bilirubin: 1 mg/dL (ref 0.3–1.2)
Total Protein: 8 g/dL (ref 6.5–8.1)

## 2019-06-04 LAB — CBC WITH DIFFERENTIAL/PLATELET
Abs Immature Granulocytes: 0.01 10*3/uL (ref 0.00–0.07)
Basophils Absolute: 0.1 10*3/uL (ref 0.0–0.1)
Basophils Relative: 1 %
Eosinophils Absolute: 0.2 10*3/uL (ref 0.0–0.5)
Eosinophils Relative: 2 %
HCT: 35.8 % — ABNORMAL LOW (ref 39.0–52.0)
Hemoglobin: 11.4 g/dL — ABNORMAL LOW (ref 13.0–17.0)
Immature Granulocytes: 0 %
Lymphocytes Relative: 34 %
Lymphs Abs: 3.2 10*3/uL (ref 0.7–4.0)
MCH: 30.1 pg (ref 26.0–34.0)
MCHC: 31.8 g/dL (ref 30.0–36.0)
MCV: 94.5 fL (ref 80.0–100.0)
Monocytes Absolute: 0.7 10*3/uL (ref 0.1–1.0)
Monocytes Relative: 7 %
Neutro Abs: 5.3 10*3/uL (ref 1.7–7.7)
Neutrophils Relative %: 56 %
Platelets: 327 10*3/uL (ref 150–400)
RBC: 3.79 MIL/uL — ABNORMAL LOW (ref 4.22–5.81)
RDW: 11.7 % (ref 11.5–15.5)
WBC: 9.4 10*3/uL (ref 4.0–10.5)
nRBC: 0 % (ref 0.0–0.2)

## 2019-06-04 MED ORDER — OXYMETAZOLINE HCL 0.05 % NA SOLN
2.0000 | Freq: Once | NASAL | Status: AC
  Administered 2019-06-04: 2 via NASAL
  Filled 2019-06-04: qty 30

## 2019-06-04 NOTE — ED Triage Notes (Signed)
Pt reports bent over to pick something up and reports nose began to bleed. Pt reports bleeding approximately 1hr. Minimal bleeding noted at this time. Pt denies any injury or being on blood thinner.pt reports history of same.

## 2019-06-04 NOTE — Discharge Instructions (Addendum)
Return if you are having any problems. 

## 2019-06-04 NOTE — Discharge Instructions (Signed)
Please follow-up with the ear nose and throat specialist on this matter.  Call to make an appointment.  Be sure they noted there is nasal packing in place. Should rebleeding occur, apply pressure to either side of the nose consistently for 15 minutes.  If bleeding continues, reapply pressure for another 10 to 15 minutes. Avoid blowing your nose and do not stick anything into the nostrils, including your finger. Return to the ED for persistent bleeding.

## 2019-06-04 NOTE — ED Provider Notes (Signed)
[facility] EMERGENCY DEPARTMENT Provider Note   CSN: [id number] Arrival date & time: 06/04/19  1313     History Chief Complaint  Patient presents with  . Epistaxis    Patrick Aguirre is a 59 y.o. male.  HPI      Patrick Aguirre is a 59 y.o. male, with a history of alcohol use, presenting to the ED with nosebleed beginning last night.  He states he is unsure from which side the bleeding is originating.  He did have some bleeding 2 days ago that stopped spontaneously. He was seen in the ED last night, bleeding was stopped, but it restarted this morning.  He has not had this issue previously.  Denies fever/chills, headaches, dizziness, syncope, nose/facial pain, trauma, nausea/vomiting, other hemorrhage, or any other complaints.  Past Medical History:  Diagnosis Date  . Arthritis   . Chronic back pain   . Environmental allergies     Patient Active Problem List   Diagnosis Date Noted  . Alcohol abuse 09/10/2018  . Elevated liver enzymes 08/07/2018  . Hyperkalemia 08/07/2018  . Essential hypertension 08/07/2018  . PAD (peripheral artery disease) (HCC) 08/07/2018  . Tobacco use disorder 08/07/2018  . Felon of finger s/p I and D 02/15/18   . Claudication in peripheral vascular disease (HCC) 12/04/2017    Past Surgical History:  Procedure Laterality Date  . LOWER EXTREMITY ANGIOGRAPHY N/A 12/05/2017   Procedure: LOWER EXTREMITY ANGIOGRAPHY;  Surgeon: Yates Decamp, MD;  Location: [facility] INVASIVE CV LAB;  Service: Cardiovascular;  Laterality: N/A;  . LUMBAR LAMINECTOMY/DECOMPRESSION MICRODISCECTOMY N/A 07/20/2016   Procedure: Right L4-5 Microdiscectomy;  Surgeon: Eldred Manges, MD;  Location: [facility] OR;  Service: Orthopedics;  Laterality: N/A;  . NO PAST SURGERIES    . PERIPHERAL VASCULAR ATHERECTOMY  12/05/2017   Procedure: PERIPHERAL VASCULAR ATHERECTOMY;  Surgeon: Yates Decamp, MD;  Location: [facility] INVASIVE CV LAB;  Service: Cardiovascular;;  . PERIPHERAL VASCULAR INTERVENTION  12/05/2017   Procedure: PERIPHERAL VASCULAR INTERVENTION;  Surgeon: Yates Decamp, MD;  Location: [facility] INVASIVE CV LAB;  Service: Cardiovascular;;       Family History  Problem Relation Age of Onset  . Diabetes Mother   . Hypertension Mother   . Heart disease Sister     Social History   Tobacco Use  . Smoking status: Current Every Day Smoker    Packs/day: 0.50  . Smokeless tobacco: Never Used  Substance Use Topics  . Alcohol use: Yes    Comment: 2 beers daily  . Drug use: No    Home Medications Prior to Admission medications   Medication Sig Start Date End Date Taking? Authorizing Provider  amLODipine (NORVASC) 5 MG tablet Take 1 tablet (5 mg total) by mouth daily. 08/07/18 04/17/19  Toniann Fail, NP  aspirin EC 81 MG tablet Take 81 mg by mouth daily.    [provider]  clopidogrel (PLAVIX) 75 MG tablet Take 75 mg by mouth daily.    [provider]  lisinopril (ZESTRIL) 5 MG tablet Take 1 tablet (5 mg total) by mouth daily. 01/01/19 04/17/19  Toniann Fail, NP  loratadine (CLARITIN) 10 MG tablet Take 10 mg by mouth daily.    [provider]  rosuvastatin (CRESTOR) 5 MG tablet Take 1 tablet (5 mg total) by mouth daily. 09/25/18 04/17/19  Toniann Fail, NP  varenicline (CHANTIX CONTINUING MONTH PAK) 1 MG tablet Take 1 tablet (1 mg total) by mouth 2 (two) times daily. 04/17/19   Yates Decamp, MD  varenicline (CHANTIX STARTING MONTH PAK) 0.5 MG X 11 & 1 MG X 42 tablet Take one 0.5 mg tablet by mouth once daily for 3 days, then increase to one 0.5 mg tablet twice daily for 4 days, then increase to one 1 mg tablet twice daily. 04/17/19   Yates Decamp, MD    Allergies    Patient has no known allergies.  Review of Systems   Review of Systems  Constitutional: Negative for chills and fever.  HENT: Positive for nosebleeds. Negative for trouble swallowing and voice change.   Respiratory: Negative for shortness of breath.   Cardiovascular: Negative for chest  pain.  Gastrointestinal: Negative for abdominal pain, nausea and vomiting.  Neurological: Negative for dizziness, syncope, weakness, light-headedness and headaches.    Physical Exam Updated Vital Signs BP 140/67 (BP Location: Right Arm)   Pulse 87   Temp 98 F (36.7 C) (Oral)   Resp 20   Ht 5\' 11"  (1.803 m)   Wt 104 kg   SpO2 98%   BMI 31.98 kg/m   Physical Exam Vitals and nursing note reviewed.  Constitutional:      General: He is not in acute distress.    Appearance: He is well-developed. [name] He is not diaphoretic.  HENT:     Head: Normocephalic and atraumatic.     Nose: No nasal deformity or nasal tenderness.     Right Nostril: No septal hematoma.     Left Nostril: Epistaxis present. No septal hematoma.     Right Sinus: No maxillary sinus tenderness or frontal sinus tenderness.     Left Sinus: No maxillary sinus tenderness or frontal sinus tenderness.     Comments: Bleeding seems to primarily originate from the left nare, however, discrete source of bleeding was not able to be identified.  There is some blood trickling from the right nare, however, this may simply be due to the bleeding from the left.    Mouth/Throat:     Mouth: Mucous membranes are moist.     Pharynx: Oropharynx is clear.  Eyes:     Conjunctiva/sclera: Conjunctivae normal.  Cardiovascular:     Rate and Rhythm: Normal rate and regular rhythm.     Pulses: Normal pulses.          Radial pulses are 2+ on the right side and 2+ on the left side.       Posterior tibial pulses are 2+ on the right side and 2+ on the left side.     Comments: Tactile temperature in the extremities appropriate and equal bilaterally. Pulmonary:     Effort: Pulmonary effort is normal. No respiratory distress.     Breath sounds: Normal breath sounds.  Abdominal:     Palpations: Abdomen is soft.     Tenderness: There is no abdominal tenderness. There is no guarding.  Musculoskeletal:     Cervical back: Neck supple.      Right lower leg: No edema.     Left lower leg: No edema.  Lymphadenopathy:     Cervical: No cervical adenopathy.  Skin:    General: Skin is warm and dry.  Neurological:     Mental Status: He is alert.  Psychiatric:        Mood and Affect: Mood and affect normal.        Speech: Speech normal.        Behavior: Behavior normal.     ED Results / Procedures / Treatments   Labs (all labs  ordered are listed, but only abnormal results are displayed) Labs Reviewed  COMPREHENSIVE METABOLIC PANEL - Abnormal; Notable for the following components:      Result Value   Sodium 130 (*)    CO2 21 (*)    Glucose, Bld 101 (*)    Calcium 8.8 (*)    AST 50 (*)    All other components within normal limits  CBC WITH DIFFERENTIAL/PLATELET - Abnormal; Notable for the following components:   RBC 3.79 (*)    Hemoglobin 11.4 (*)    HCT 35.8 (*)    All other components within normal limits   Hemoglobin  Date Value Ref Range Status  06/04/2019 11.4 (L) 13.0 - 17.0 g/dL Final  03/19/2019 14.3 13.0 - 17.0 g/dL Final  01/13/2019 12.6 (L) 13.0 - 17.0 g/dL Final  07/17/2018 12.4 (L) 13.0 - 17.7 g/dL Final  07/19/2016 14.8 13.0 - 17.0 g/dL Final    EKG None  Radiology No results found.  Procedures .Epistaxis Management  Date/Time: 06/04/2019 4:10 PM Performed by: Lorayne Bender, PA-C Authorized by: Lorayne Bender, PA-C   Consent:    Consent obtained:  Verbal   Consent given by:  Patient   Risks discussed:  Bleeding, nasal injury, pain and infection Anesthesia (see MAR for exact dosages):    Anesthesia method:  None Procedure details:    Treatment site:  L anterior   Treatment method:  Nasal balloon   Treatment complexity:  Extensive   Treatment episode: recurring   Post-procedure details:    Assessment:  Bleeding decreased   Patient tolerance of procedure:  Tolerated well, no immediate complications Comments:     5.5 cm Rhino Rocket (anterior bleed) .Epistaxis Management  Date/Time:  06/04/2019 3:15 PM Performed by: Lorayne Bender, PA-C Authorized by: Lorayne Bender, PA-C   Consent:    Consent obtained:  Verbal   Consent given by:  Patient   Risks discussed:  Bleeding, nasal injury, pain and infection Procedure details:    Treatment site:  L anterior   Repair method: Direct pressure on bilateral nares.   Treatment complexity:  Limited   Treatment episode: initial   Post-procedure details:    Assessment:  No improvement   Patient tolerance of procedure:  Tolerated well, no immediate complications .Epistaxis Management  Date/Time: 06/04/2019 3:25 PM Performed by: Lorayne Bender, PA-C Authorized by: Lorayne Bender, PA-C   Consent:    Consent obtained:  Verbal   Consent given by:  Patient   Risks discussed:  Bleeding, infection, nasal injury and pain Procedure details:    Treatment site:  L anterior   Repair method: Afrin.   Treatment complexity:  Limited   Treatment episode: recurring   Post-procedure details:    Assessment:  No improvement   Patient tolerance of procedure:  Tolerated well, no immediate complications   (including critical care time)  Medications Ordered in ED Medications  oxymetazoline (AFRIN) 0.05 % nasal spray 2 spray (2 sprays Each Nare Given 06/04/19 1523)    ED Course  I have reviewed the triage vital signs and the nursing notes.  Pertinent labs & imaging results that were available during my care of the patient were reviewed by me and considered in my medical decision making (see chart for details).  Clinical Course as of Jun 03 2133  Tue Jun 04, 2019  1728 Hemostasis still in effect.  Patient states he feels much more comfortable.  No noted distress.   [SJ]  Clinical Course User Index [SJ] Danayah Smyre, Lorella Nimrod   MDM Rules/Calculators/A&P                      Patient presents with epistaxis.  No trauma.  Blood pressures at an acceptable level.  No airway compromise.  Cessation of hemorrhage achieved. Patient did have a change in  his hemoglobin.  He will follow-up with a PCP for continued tracking of this value to assure recovery. ENT follow-up for further management of epistaxis and for removal of Rhino Rocket.  Message sent to Dr. Suszanne Conners, who states he will see the patient in the office. The patient was given instructions for home care as well as return precautions. Patient voices understanding of these instructions, accepts the plan, and is comfortable with discharge.  Findings and plan of care discussed with Cathi Roan, MD.   Final Clinical Impression(s) / ED Diagnoses Final diagnoses:  Left-sided epistaxis    Rx / DC Orders ED Discharge Orders    None       Concepcion Living 06/04/19 2137    Maia Plan, MD 06/05/19 1158

## 2019-06-06 ENCOUNTER — Emergency Department (HOSPITAL_COMMUNITY)
Admission: EM | Admit: 2019-06-06 | Discharge: 2019-06-06 | Disposition: A | Discharge status: Home / Self Care | Payer: Medicaid Other | Attending: Emergency Medicine | Admitting: Emergency Medicine

## 2019-06-06 ENCOUNTER — Encounter (HOSPITAL_COMMUNITY): Payer: Self-pay | Admitting: Emergency Medicine

## 2019-06-06 DIAGNOSIS — Z79899 Other long term (current) drug therapy: Secondary | ICD-10-CM | POA: Insufficient documentation

## 2019-06-06 DIAGNOSIS — Z4800 Encounter for change or removal of nonsurgical wound dressing: Secondary | ICD-10-CM | POA: Insufficient documentation

## 2019-06-06 DIAGNOSIS — F17210 Nicotine dependence, cigarettes, uncomplicated: Secondary | ICD-10-CM | POA: Diagnosis not present

## 2019-06-06 DIAGNOSIS — Z7982 Long term (current) use of aspirin: Secondary | ICD-10-CM | POA: Insufficient documentation

## 2019-06-06 DIAGNOSIS — I10 Essential (primary) hypertension: Secondary | ICD-10-CM | POA: Insufficient documentation

## 2019-06-06 MED ORDER — OXYMETAZOLINE HCL 0.05 % NA SOLN
1.0000 | Freq: Once | NASAL | Status: DC
  Filled 2019-06-06: qty 30

## 2019-06-06 NOTE — Discharge Instructions (Signed)
Return if any problems.

## 2019-06-06 NOTE — ED Provider Notes (Signed)
[facility] Provider Note   CSN: [id number] Arrival date & time: 06/06/19  4259     History No chief complaint on file.   Patrick Aguirre is a 59 y.o. male.  Pt here for removal of nasal packing.  Pt complains of discomfort from packing  The history is provided by the patient. No language interpreter was used.       Past Medical History:  Diagnosis Date  . Arthritis   . Chronic back pain   . Environmental allergies     Patient Active Problem List   Diagnosis Date Noted  . Alcohol abuse 09/10/2018  . Elevated liver enzymes 08/07/2018  . Hyperkalemia 08/07/2018  . Essential hypertension 08/07/2018  . PAD (peripheral artery disease) ([place]) 08/07/2018  . Tobacco use disorder 08/07/2018  . Felon of finger s/p I and D 02/15/18   . Claudication in peripheral vascular disease ([place]) 12/04/2017    Past Surgical History:  Procedure Laterality Date  . LOWER EXTREMITY ANGIOGRAPHY N/A 12/05/2017   Procedure: LOWER EXTREMITY ANGIOGRAPHY;  Surgeon: Adrian Prows, MD;  Location: [place] CV LAB;  Service: Cardiovascular;  Laterality: N/A;  . LUMBAR LAMINECTOMY/DECOMPRESSION MICRODISCECTOMY N/A 07/20/2016   Procedure: Right L4-5 Microdiscectomy;  Surgeon: Marybelle Killings, MD;  Location: [facility];  Service: Orthopedics;  Laterality: N/A;  . NO PAST SURGERIES    . PERIPHERAL VASCULAR ATHERECTOMY  12/05/2017   Procedure: PERIPHERAL VASCULAR ATHERECTOMY;  Surgeon: Adrian Prows, MD;  Location: [place] CV LAB;  Service: Cardiovascular;;  . PERIPHERAL VASCULAR INTERVENTION  12/05/2017   Procedure: PERIPHERAL VASCULAR INTERVENTION;  Surgeon: Adrian Prows, MD;  Location: [place] CV LAB;  Service: Cardiovascular;;       Family History  Problem Relation Age of Onset  . Diabetes Mother   . Hypertension Mother   . Heart disease Sister     Social History   Tobacco Use  . Smoking status: Current Every Day Smoker    Packs/day: 0.50  . Smokeless tobacco: Never Used    Substance Use Topics  . Alcohol use: Yes    Comment: 2 beers daily  . Drug use: No    Home Medications Prior to Admission medications   Medication Sig Start Date End Date Taking? Authorizing Provider  amLODipine (NORVASC) 5 MG tablet Take 1 tablet (5 mg total) by mouth daily. 08/07/18 04/17/19  Miquel Dunn, NP  aspirin EC 81 MG tablet Take 81 mg by mouth daily.    [provider]  clopidogrel (PLAVIX) 75 MG tablet Take 75 mg by mouth daily.    [provider]  lisinopril (ZESTRIL) 5 MG tablet Take 1 tablet (5 mg total) by mouth daily. 01/01/19 04/17/19  Miquel Dunn, NP  loratadine (CLARITIN) 10 MG tablet Take 10 mg by mouth daily.    [provider]  rosuvastatin (CRESTOR) 5 MG tablet Take 1 tablet (5 mg total) by mouth daily. 09/25/18 04/17/19  Miquel Dunn, NP  varenicline (CHANTIX CONTINUING MONTH PAK) 1 MG tablet Take 1 tablet (1 mg total) by mouth 2 (two) times daily. 04/17/19   Adrian Prows, MD  varenicline (CHANTIX STARTING MONTH PAK) 0.5 MG X 11 & 1 MG X 42 tablet Take one 0.5 mg tablet by mouth once daily for 3 days, then increase to one 0.5 mg tablet twice daily for 4 days, then increase to one 1 mg tablet twice daily. 04/17/19   Adrian Prows, MD    Allergies    Patient has no  known allergies.  Review of Systems   Review of Systems  All other systems reviewed and are negative.   Physical Exam Updated Vital Signs BP (!) 138/53 (BP Location: Right Arm)   Pulse 85   Temp 98.6 F (37 C) (Oral)   Resp 18   Ht 5\' 11"  (1.803 m)   Wt 104.3 kg   SpO2 96%   BMI 32.08 kg/m   Physical Exam Vitals reviewed.  HENT:     Head: Normocephalic.     Nose:     Comments: Packing removed. Cardiovascular:     Rate and Rhythm: Normal rate.  Pulmonary:     Effort: Pulmonary effort is normal.  Skin:    General: Skin is warm.  Neurological:     General: No focal deficit present.     Mental Status: He is alert.  Psychiatric:         Mood and Affect: Mood normal.     ED Results / Procedures / Treatments   Labs (all labs ordered are listed, but only abnormal results are displayed) Labs Reviewed - No data to display  EKG None  Radiology No results found.  Procedures Procedures (including critical care time)  Medications Ordered in ED Medications  oxymetazoline (AFRIN) 0.05 % nasal spray 1 spray (has no administration in time range)    ED Course  I have reviewed the triage vital signs and the nursing notes.  Pertinent labs & imaging results that were available during my care of the patient were reviewed by me and considered in my medical decision making (see chart for details).    MDM Rules/Calculators/A&P                      MDM  Small amount of light blood.  Pt given afrin.  Pt advised to hold pressure.  Final Clinical Impression(s) / ED Diagnoses Final diagnoses:  Encounter for removal of nasal packing    Rx / DC Orders ED Discharge Orders    None       06/06/19 1046    06/08/19, MD 06/06/19 [phone]

## 2019-06-14 NOTE — Progress Notes (Signed)
Unable to reach patient phone giving busy signal

## 2019-07-03 ENCOUNTER — Other Ambulatory Visit: Payer: Self-pay

## 2019-07-03 MED ORDER — LISINOPRIL 5 MG PO TABS: 5.0000 mg | ORAL_TABLET | Freq: Every day | ORAL | 1 refills | Status: DC

## 2019-07-19 ENCOUNTER — Other Ambulatory Visit: Payer: Self-pay

## 2019-07-19 ENCOUNTER — Ambulatory Visit: Payer: Medicaid Other | Admitting: Cardiology

## 2019-07-19 ENCOUNTER — Encounter: Payer: Self-pay | Admitting: Cardiology

## 2019-07-19 VITALS — BP 148/78 | HR 72 | Temp 95.5°F | Ht 71.0 in | Wt 244.8 lb

## 2019-07-19 DIAGNOSIS — I10 Essential (primary) hypertension: Secondary | ICD-10-CM

## 2019-07-19 DIAGNOSIS — I739 Peripheral vascular disease, unspecified: Secondary | ICD-10-CM
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
## 2019-08-01 ENCOUNTER — Telehealth: Payer: Self-pay | Admitting: Pharmacist

## 2019-08-01 NOTE — Telephone Encounter (Signed)
Called pt to inquire if patient would be interested in enrolling in RPM for BP management. Pt stated that he would be interested and would come by the office sometime next week to pick-up the BP cuff. Will add patient to RPM list and complete enrollment paperwork upon clinic visit.

## 2019-08-23 ENCOUNTER — Ambulatory Visit: Payer: Medicaid Other | Attending: Internal Medicine

## 2019-08-23 DIAGNOSIS — Z23 Encounter for immunization: Secondary | ICD-10-CM

## 2019-08-23 NOTE — Progress Notes (Signed)
   Covid-19 Vaccination Clinic  Name:  Patrick Aguirre    MRN: [id number] DOB: 1960-05-27  08/23/2019  Patrick Aguirre was observed post Covid-19 immunization for 15 minutes without incident. He was provided with Vaccine Information Sheet and instruction to access the V-Safe system.   Patrick Aguirre was instructed to call 911 with any severe reactions post vaccine: Marland Kitchen Difficulty breathing  . Swelling of face and throat  . A fast heartbeat  . A bad rash all over body  . Dizziness and weakness   Immunizations Administered    Name Date Dose VIS Date Route   Moderna COVID-19 Vaccine 08/23/2019  9:00 AM 0.5 mL 04/23/2019 Intramuscular   Manufacturer: Moderna   Lot: [id number]   NDC: 02301-720-91

## 2019-09-16 ENCOUNTER — Other Ambulatory Visit: Payer: Self-pay | Admitting: Pharmacist

## 2019-09-16 DIAGNOSIS — I10 Essential (primary) hypertension: Secondary | ICD-10-CM

## 2019-09-16 MED ORDER — AMLODIPINE BESYLATE 5 MG PO TABS: 5.0000 mg | ORAL_TABLET | Freq: Every day | ORAL | 2 refills | Status: DC

## 2019-09-24 ENCOUNTER — Ambulatory Visit: Payer: Medicaid Other | Attending: Internal Medicine

## 2019-09-24 DIAGNOSIS — Z23 Encounter for immunization: Secondary | ICD-10-CM

## 2019-09-24 NOTE — Progress Notes (Signed)
   Covid-19 Vaccination Clinic  Name:  Patrick Aguirre    MRN: [id number] DOB: 1960/12/07  09/24/2019  [name] was observed post Covid-19 immunization for 15 minutes without incident. He was provided with Vaccine Information Sheet and instruction to access the V-Safe system.   [name] was instructed to call 911 with any severe reactions post vaccine: Marland Kitchen Difficulty breathing  . Swelling of face and throat  . A fast heartbeat  . A bad rash all over body  . Dizziness and weakness   Immunizations Administered    Name Date Dose VIS Date Route   Moderna COVID-19 Vaccine 09/24/2019  9:16 AM 0.5 mL 04/2019 Intramuscular   Manufacturer: Moderna   Lot: [id number]   NDC: 92330-076-22

## 2019-10-09 ENCOUNTER — Other Ambulatory Visit: Payer: Self-pay

## 2019-10-09 MED ORDER — ROSUVASTATIN CALCIUM 5 MG PO TABS: 5.0000 mg | ORAL_TABLET | Freq: Every day | ORAL | 3 refills | Status: DC

## 2019-10-10 ENCOUNTER — Other Ambulatory Visit: Payer: Self-pay | Admitting: Cardiology

## 2019-10-14 NOTE — Progress Notes (Signed)
Primary Physician/Referring:  Suzan Slick, MD  Patient ID: Patrick Aguirre, male    DOB: Nov 04, 1960, 59 y.o.   MRN: [id number]  Chief Complaint  Patient presents with  . Hypertension  . PAD  . Follow-up    3 month   HPI:    Patrick Aguirre  is a 59 y.o. AAM with chronic tobacco use quit 04/17/2019, essential hypertension, elevated LFTs felt to be due to statins and also hypokalemia and unable to tolerate high-dose ACE inhibitor due to hyperkalemia and chronic back pain s/p L4-5 microdisectomy in Feb 2018. Patient is S/P right common iliac artery stenting on 12/05/2017, with improvement in symptoms of right hip claudication.  He still has pseudoclaudication/neurogenic claudication.  Except for chronic back pain, he has no specific complaints today.  States that he is able to walk without symptoms of claudication involving his bilateral calves.  He still has back pain and occasionally left hip pain related to neurogenic claudication.  He now presents for follow-up of hypertension, abnormal liver enzymes and hyperlipidemia and PAD.  This is a 93-month office visit performed on 10/16/2019.   Past Medical History:  Diagnosis Date  . Arthritis   . Chronic back pain   . Environmental allergies   . Hypertension    Past Surgical History:  Procedure Laterality Date  . LOWER EXTREMITY ANGIOGRAPHY N/A 12/05/2017   Procedure: LOWER EXTREMITY ANGIOGRAPHY;  Surgeon: Yates Decamp, MD;  Location: [facility] INVASIVE CV LAB;  Service: Cardiovascular;  Laterality: N/A;  . LUMBAR LAMINECTOMY/DECOMPRESSION MICRODISCECTOMY N/A 07/20/2016   Procedure: Right L4-5 Microdiscectomy;  Surgeon: Eldred Manges, MD;  Location: [facility] OR;  Service: Orthopedics;  Laterality: N/A;  . NO PAST SURGERIES    . PERIPHERAL VASCULAR ATHERECTOMY  12/05/2017   Procedure: PERIPHERAL VASCULAR ATHERECTOMY;  Surgeon: Yates Decamp, MD;  Location: [facility] INVASIVE CV LAB;  Service: Cardiovascular;;  . PERIPHERAL VASCULAR INTERVENTION  12/05/2017   Procedure:  PERIPHERAL VASCULAR INTERVENTION;  Surgeon: Yates Decamp, MD;  Location: [facility] INVASIVE CV LAB;  Service: Cardiovascular;;   Family History  Problem Relation Age of Onset  . Diabetes Mother   . Hypertension Mother   . Heart disease Sister     Social History   Tobacco Use  . Smoking status: Former Smoker    Packs/day: 0.50    Quit date: 05/20/2019    Years since quitting: 0.4  . Smokeless tobacco: Never Used  Substance Use Topics  . Alcohol use: Yes    Comment: 2 beers daily    Marital Status: Married  ROS  Review of Systems  Cardiovascular: Positive for claudication (neurogenic). Negative for chest pain, dyspnea on exertion and leg swelling.  Musculoskeletal: Positive for back pain and joint pain (right hip).  Gastrointestinal: Negative for melena.   Objective   Vitals with BMI 10/16/2019 07/19/2019 06/06/2019  Height 5\' 11"  5\' 11"  -  Weight 233 lbs 2 oz 244 lbs 13 oz -  BMI 32.53 34.16 -  Systolic 126 148  Diastolic 68 78 60  Pulse 64 72 91     Physical Exam  Constitutional: He appears well-developed and well-nourished. No distress.  Mildly Obese  Cardiovascular: Normal rate, regular rhythm and normal heart sounds. Exam reveals no gallop.  No murmur heard. Pulses:      Carotid pulses are 2+ on the right side with bruit and 2+ on the left side with bruit.      Femoral pulses are 1+ on the right side and 1+ on the left  side.      Popliteal pulses are 0 on the right side and 2+ on the left side.       Dorsalis pedis pulses are 0 on the right side and 0 on the left side.       Posterior tibial pulses are 2+ on the right side and 2+ on the left side.  No JVD, No legg edema. Normal capillary fill present  Pulmonary/Chest: Effort normal and breath sounds normal.  Abdominal: Soft. Bowel sounds are normal.  Vitals reviewed.  Laboratory examination:   Recent Labs    12/05/18 1521 12/05/18 1521 01/13/19 1126 03/19/19 1421 06/04/19 1630  NA  --   --  130* 137 130*  K   --   --  4.0 3.9 3.9  CL  --   --  100 105 98  CO2  --   --  21*  --  21*  GLUCOSE  --   --  100* 91 101*  BUN 10   < > 6 8 15   CREATININE 0.89  --  0.71 0.70 0.95  CALCIUM  --   --  8.6*  --  8.8*  GFRNONAA 93  --  >60  --  >60  GFRAA 108  --  >60  --  >60   < > = values in this interval not displayed.   CrCl cannot be calculated (Patient's most recent lab result is older than the maximum 21 days allowed.).  CMP Latest Ref Rng & Units 06/04/2019 03/19/2019 01/13/2019  Glucose 70 - 99 mg/dL 101(H) 91 100(H)  BUN 6 - 20 mg/dL 15 8 6   Creatinine 0.61 - 1.24 mg/dL 0.95 0.70 0.71  Sodium 135 - 145 mmol/L 130(L) 137 130(L)  Potassium 3.5 - 5.1 mmol/L 3.9 3.9 4.0  Chloride 98 - 111 mmol/L 98 105 100  CO2 22 - 32 mmol/L 21(L) - 21(L)  Calcium 8.9 - 10.3 mg/dL 8.8(L) - 8.6(L)  Total Protein 6.5 - 8.1 g/dL 8.0 - 8.1  Total Bilirubin 0.3 - 1.2 mg/dL 1.0 - 0.4  Alkaline Phos 38 - 126 U/L 69 - 59  AST 15 - 41 U/L 50(H) - 55(H)  ALT 0 - 44 U/L 39 - 30   CBC Latest Ref Rng & Units 06/04/2019 03/19/2019 01/13/2019  WBC 4.0 - 10.5 K/uL 9.4 - 8.5  Hemoglobin 13.0 - 17.0 g/dL 11.4(L) 14.3 12.6(L)  Hematocrit 39.0 - 52.0 % 35.8(L) 42.0 39.3  Platelets 150 - 400 K/uL 327 - 366   Lipid Panel     Component Value Date/Time   CHOL 158 07/17/2018 1026   TRIG 48 07/17/2018 1026   HDL 83 07/17/2018 1026   LDLCALC 65 07/17/2018 1026   HEMOGLOBIN A1C No results found for: HGBA1C, MPG TSH No results for input(s): TSH in the last 8760 hours.   External Labs:  TSH 2.690 09/08/2017  Medications and allergies  No Known Allergies   Current Outpatient Medications  Medication Instructions  . amLODipine (NORVASC) 5 mg, Oral, Daily  . aspirin EC 81 mg, Oral, Daily  . lisinopril (ZESTRIL) 5 mg, Oral, Daily  . rosuvastatin (CRESTOR) 5 MG tablet Take 1 tablet by mouth once daily    Radiology:   CT of the abdomen 01/07/2019: 1. Mild hepatomegaly. No evidence of liver mass or other hepatic  abnormality. 2. Cholelithiasis. No radiographic evidence of cholecystitis. 3. Aortic Atherosclerosis (ICD10-I70.0).  Ultrasound of the abdomen 12/24/2018: Normal.  Cardiac Studies:   Lexiscan myoview stress test 09/18/2017: 1.  The resting electrocardiogram demonstrated normal sinus rhythm, poor R progression, normal resting conduction, no resting arrhythmias and non-specific T changes. Stress EKG is non-diagnostic for ischemia as it a pharmacologic stress using Lexiscan. The patient developed significant symptoms which included Flushing. 2. LV is dilated both at rest and stress images. The LV end diastolic volume was 148 mL. Stress and rest SPECT images demonstrate homogeneous tracer distribution throughout the myocardium. Gated SPECT imaging reveals normal myocardial thickening and wall motion. The left ventricular ejection fraction was low normal (53%). This is a low risk study.  Carotid artery duplex 04/09/2018: No hemodynamically significant arterial disease in the internal carotid artery bilaterally. Very mild heterogeneous plaque noted bilaterally. Antegrade right vertebral artery flow. Antegrade left vertebral artery flow.  Peripheral arteriogram 12/05/2017: CSI atherectomy followed by stenting of right CIA with 10.0 x 39 mm Viabhan VBX, 100% to 0%. Left CIA 50% stenosis without gradient.  Lower Extremity Arterial Duplex 05/22/2019:  No hemodynamically significant stenoses are identified in the right lower extremity arterial system. No hemodynamically significant stenoses are identified in the left lower extremity arterial system.  This exam reveals mildly decreased perfusion of the right lower extremity, noted at the dorsalis pedis artery level (ABI 0.96) and mildly decreased  perfusion of the left lower extremity, noted at the post tibial artery level (ABI 0.96).  Compared to the study done on 11/21/2017, markedly abnormal monophasic waveform throughout the right lower extremity is no  longer present, right  ABI is improved from 0.52.  This suggest patency of right iliac artery stent.  Abdominal Aortic Duplex 06/03/2019:  The maximum aorta (sac) diameter is 1.96 cm (dist). No evidence of atherosclerotic plaque. Normal flow velocities noted in the aorta and iliac arteries. Diffuse aortic atherosclerosis.  EKG   EKG 10/15/2009: Normal sinus rhythm with rate of 62 bpm, left atrial enlargement, normal axis.  Anteroseptal infarct old.  Borderline low voltage complexes.  Nonspecific T abnormality, V1 to V3 cannot exclude anterior ischemia.  No significant change since 01/13/2019.   Assessment     ICD-10-CM   1. PAD (peripheral artery disease) (HCC)  I73.9   2. Essential hypertension  I10 EKG 12-Lead  3. Elevated liver enzymes  R74.8   4. Alcohol use  Z72.89   5. Tobacco use disorder  F17.200     No orders of the defined types were placed in this encounter.  There are no discontinued medications.  Recommendations:   Patrick Aguirre  is a 59 y.o. AAM with chronic tobacco use quit 04/17/2019, essential hypertension, elevated LFTs felt to be due to statins and also hypokalemia and unable to tolerate high-dose ACE inhibitor due to hyperkalemia and chronic back pain s/p L4-5 microdisectomy in Feb 2018. Patient is S/P right common iliac artery stenting on 12/05/2017, with improvement in symptoms of right hip claudication.  He still has pseudoclaudication/neurogenic claudication left hip and back pain.  This is a 69-month office visit,no chest pain or shortness of breath.  He is presently on low-dose of Crestor with good control of his LDL, he does have fatty liver and also elevated LFTs probably related to alcohol intake which he has reduced pretty significantly.  He is trying to quit.  With regard to tobacco use, he is not smoking now but occasionally smokes 1 cigarette a day but he plans to not smoke at all.  He has made lifestyle changes with regard to his diet as well.  Blood  pressure is now well controlled, no clinical evidence of progression  of vascular disease, I am pleased with his progress.  I will see him back in 6 months for follow-up of hypertension, hyperlipidemia and PAD.    Yates Decamp, MD, [facility] 10/16/2019, 12:40 PM [facility] Pager: [phone] Office: [phone]

## 2019-10-16 ENCOUNTER — Ambulatory Visit: Payer: Medicaid Other | Admitting: Cardiology

## 2019-10-16 ENCOUNTER — Other Ambulatory Visit: Payer: Self-pay

## 2019-10-16 ENCOUNTER — Encounter: Payer: Self-pay | Admitting: Cardiology

## 2019-10-16 VITALS — BP 126/68 | HR 64 | Resp 16 | Ht 71.0 in | Wt 233.1 lb

## 2019-10-16 DIAGNOSIS — I10 Essential (primary) hypertension: Secondary | ICD-10-CM

## 2019-10-16 DIAGNOSIS — F1090 Alcohol use, unspecified, uncomplicated: Secondary | ICD-10-CM

## 2019-10-16 DIAGNOSIS — R748 Abnormal levels of other serum enzymes: Secondary | ICD-10-CM

## 2019-10-16 DIAGNOSIS — Z789 Other specified health status: Secondary | ICD-10-CM

## 2019-10-16 DIAGNOSIS — I739 Peripheral vascular disease, unspecified: Secondary | ICD-10-CM

## 2019-10-16 DIAGNOSIS — F17200 Nicotine dependence, unspecified, uncomplicated: Secondary | ICD-10-CM

## 2020-01-16 ENCOUNTER — Ambulatory Visit: Payer: Medicaid Other | Admitting: Cardiology

## 2020-02-06 ENCOUNTER — Encounter: Payer: Self-pay | Admitting: Cardiology

## 2020-02-06 ENCOUNTER — Ambulatory Visit: Payer: Medicaid Other | Admitting: Cardiology

## 2020-02-06 ENCOUNTER — Other Ambulatory Visit: Payer: Self-pay

## 2020-02-06 VITALS — BP 130/64 | HR 78 | Resp 15 | Ht 71.0 in | Wt 234.0 lb

## 2020-02-06 DIAGNOSIS — I10 Essential (primary) hypertension: Secondary | ICD-10-CM

## 2020-02-06 DIAGNOSIS — F17200 Nicotine dependence, unspecified, uncomplicated: Secondary | ICD-10-CM

## 2020-02-06 DIAGNOSIS — E7800 Pure hypercholesterolemia, unspecified: Secondary | ICD-10-CM

## 2020-02-06 DIAGNOSIS — R0609 Other forms of dyspnea: Secondary | ICD-10-CM

## 2020-02-06 DIAGNOSIS — I739 Peripheral vascular disease, unspecified: Secondary | ICD-10-CM

## 2020-02-06 NOTE — Progress Notes (Signed)
Primary Physician/Referring:  Leeanne Rio, MD  Patient ID: Patrick Aguirre, male    DOB: 1961/04/14, 59 y.o.   MRN: [id number]  Chief Complaint  Patient presents with   Follow-up    6 month   PAD   Hypertension   HPI:    Patrick Aguirre  is a 59 y.o. AAM with chronic tobacco use quit, essential hypertension, elevated LFTs felt to be due to statins and also hypokalemia and unable to tolerate high-dose ACE inhibitor due to hyperkalemia and chronic back pain s/p L4-5 microdisectomy in Feb 2018. Patient is S/P right common iliac artery stenting on 12/05/2017, with improvement in symptoms of right hip claudication.  He still has pseudoclaudication/neurogenic claudication.  The patient presents today for four month follow up. He is having mild symptoms of claudication only when walking up hill. He has had dyspnea on exertion as well but states this is fairly stable. He had previously quit smoking, but is currently smoking 4 cigarettes per day. He is also drinking 3-4 cans of beer at night after work. Denies chest pain, palpitations, leg swelling. He still has occasionally back and hip pain related to neurogenic claudication.   Past Medical History:  Diagnosis Date   Arthritis    Chronic back pain    Environmental allergies    Hypertension    Past Surgical History:  Procedure Laterality Date   LOWER EXTREMITY ANGIOGRAPHY N/A 12/05/2017   Procedure: LOWER EXTREMITY ANGIOGRAPHY;  Surgeon: Adrian Prows, MD;  Location: [facility];  Service: Cardiovascular;  Laterality: N/A;   LUMBAR LAMINECTOMY/DECOMPRESSION MICRODISCECTOMY N/A 07/20/2016   Procedure: Right L4-5 Microdiscectomy;  Surgeon: Marybelle Killings, MD;  Location: [facility];  Service: Orthopedics;  Laterality: N/A;   NO PAST SURGERIES     PERIPHERAL VASCULAR ATHERECTOMY  12/05/2017   Procedure: PERIPHERAL VASCULAR ATHERECTOMY;  Surgeon: Adrian Prows, MD;  Location: [facility];  Service: Cardiovascular;;   PERIPHERAL  VASCULAR INTERVENTION  12/05/2017   Procedure: PERIPHERAL VASCULAR INTERVENTION;  Surgeon: Adrian Prows, MD;  Location: [facility];  Service: Cardiovascular;;   Family History  Problem Relation Age of Onset   Diabetes Mother    Hypertension Mother    Heart disease Sister     Social History   Tobacco Use   Smoking status: Former Smoker    Packs/day: 0.50    Quit date: 05/20/2019    Years since quitting: 0.7   Smokeless tobacco: Never Used  Substance Use Topics   Alcohol use: Yes    Comment: 2 beers daily    Marital Status: Married  ROS  Review of Systems  Cardiovascular: Positive for claudication, dyspnea on exertion. Negative for chest pain and leg swelling.  Musculoskeletal: Positive for back pain and joint pain (right hip).  Gastrointestinal: Negative for melena.   Objective   Blood pressure 130/64, pulse 78, resp. rate 15, height 5' 11"  (1.803 m), weight 106.1 kg, SpO2 98 %.  Vitals with BMI 02/06/2020 10/16/2019 07/19/2019  Height 5' 11"  5' 11"  5' 11"   Weight 234 lbs 233 lbs 2 oz 244 lbs 13 oz  BMI 32.65 67.67 20.94  Systolic 709 628 366  Diastolic 64 68 78  Pulse 78 64 72     Physical Exam  Constitutional: He appears well-developed and well-nourished. No distress.  Mildly Obese  Cardiovascular: Normal rate, regular rhythm and normal heart sounds. Exam reveals no gallop. No murmur heard. Pulses:      Carotid pulses are 2+ on the  right side with bruit and 2+ on the left side with bruit.      Femoral pulses are 1+ on the right side and 1+ on the left side.      Popliteal pulses are 0 on the right side and 2+ on the left side.       Dorsalis pedis pulses are 0 on the right side and 1+ on the left side.       Posterior tibial pulses are 2+ on the right side and 2+ on the left side.  No JVD, No leg edema. Normal capillary fill present  Pulmonary/Chest: Effort normal and breath sounds normal.  Abdominal: Soft. Bowel sounds are normal.  Vitals  reviewed.  Laboratory examination:   Recent Labs    03/19/19 1421 06/04/19 1630  NA 137 130*  K 3.9 3.9  CL 105 98  CO2  --  21*  GLUCOSE 91 101*  BUN 8 15  CREATININE 0.70 0.95  CALCIUM  --  8.8*  GFRNONAA  --  >60  GFRAA  --  >60   CrCl cannot be calculated (Patient's most recent lab result is older than the maximum 21 days allowed.).  CMP Latest Ref Rng & Units 06/04/2019 03/19/2019 01/13/2019  Glucose 70 - 99 mg/dL 101(H) 91 100(H)  BUN 6 - 20 mg/dL 15 8 6   Creatinine 0.61 - 1.24 mg/dL 0.95 0.70 0.71  Sodium 135 - 145 mmol/L 130(L) 137 130(L)  Potassium 3.5 - 5.1 mmol/L 3.9 3.9 4.0  Chloride 98 - 111 mmol/L 98 105 100  CO2 22 - 32 mmol/L 21(L) - 21(L)  Calcium 8.9 - 10.3 mg/dL 8.8(L) - 8.6(L)  Total Protein 6.5 - 8.1 g/dL 8.0 - 8.1  Total Bilirubin 0.3 - 1.2 mg/dL 1.0 - 0.4  Alkaline Phos 38 - 126 U/L 69 - 59  AST 15 - 41 U/L 50(H) - 55(H)  ALT 0 - 44 U/L 39 - 30   CBC Latest Ref Rng & Units 06/04/2019 03/19/2019 01/13/2019  WBC 4.0 - 10.5 K/uL 9.4 - 8.5  Hemoglobin 13.0 - 17.0 g/dL 11.4(L) 14.3 12.6(L)  Hematocrit 39 - 52 % 35.8(L) 42.0 39.3  Platelets 150 - 400 K/uL 327 - 366   Lipid Panel     Component Value Date/Time   CHOL 158 07/17/2018 1026   TRIG 48 07/17/2018 1026   HDL 83 07/17/2018 1026   LDLCALC 65 07/17/2018 1026     HEMOGLOBIN A1C No results found for: HGBA1C, MPG   TSH No results for input(s): TSH in the last 8760 hours.   External Labs:  AIC 5.0% 08/21/2019  TSH 2.690 09/08/2017  Medications and allergies  No Known Allergies   Current Outpatient Medications on File Prior to Visit  Medication Sig Dispense Refill   amLODipine (NORVASC) 5 MG tablet Take 1 tablet (5 mg total) by mouth daily. 90 tablet 2   aspirin EC 81 MG tablet Take 81 mg by mouth daily.     lisinopril (ZESTRIL) 5 MG tablet Take 1 tablet (5 mg total) by mouth daily. 90 tablet 1   rosuvastatin (CRESTOR) 5 MG tablet Take 1 tablet by mouth once daily 90 tablet 0    No current facility-administered medications on file prior to visit.    Radiology:   CT of the abdomen 01/07/2019: 1. Mild hepatomegaly. No evidence of liver mass or other hepatic abnormality. 2. Cholelithiasis. No radiographic evidence of cholecystitis. 3. Aortic Atherosclerosis (ICD10-I70.0).  Ultrasound of the abdomen 12/24/2018: Normal.  Cardiac Studies:  Lexiscan myoview stress test 09/18/2017: 1. The resting electrocardiogram demonstrated normal sinus rhythm, poor R progression, normal resting conduction, no resting arrhythmias and non-specific T changes. Stress EKG is non-diagnostic for ischemia as it a pharmacologic stress using Lexiscan. The patient developed significant symptoms which included Flushing. 2. LV is dilated both at rest and stress images. The LV end diastolic volume was 453 mL. Stress and rest SPECT images demonstrate homogeneous tracer distribution throughout the myocardium. Gated SPECT imaging reveals normal myocardial thickening and wall motion. The left ventricular ejection fraction was low normal (53%). This is a low risk study.  Carotid artery duplex 04/09/2018: No hemodynamically significant arterial disease in the internal carotid artery bilaterally. Very mild heterogeneous plaque noted bilaterally. Antegrade right vertebral artery flow. Antegrade left vertebral artery flow.  Peripheral arteriogram 12/05/2017: CSI atherectomy followed by stenting of right CIA with 10.0 x 39 mm Viabhan VBX, 100% to 0%. Left CIA 50% stenosis without gradient.  Lower Extremity Arterial Duplex 05/22/2019:  No hemodynamically significant stenoses are identified in the right lower extremity arterial system. No hemodynamically significant stenoses are identified in the left lower extremity arterial system.  This exam reveals mildly decreased perfusion of the right lower extremity, noted at the dorsalis pedis artery level (ABI 0.96) and mildly decreased  perfusion of the left  lower extremity, noted at the post tibial artery level (ABI 0.96).  Compared to the study done on 11/21/2017, markedly abnormal monophasic waveform throughout the right lower extremity is no longer present, right  ABI is improved from 0.52.  This suggest patency of right iliac artery stent.  Abdominal Aortic Duplex 06/03/2019:  The maximum aorta (sac) diameter is 1.96 cm (dist). No evidence of atherosclerotic plaque. Normal flow velocities noted in the aorta and iliac arteries. Diffuse aortic atherosclerosis.  EKG   EKG 10/15/2009: Normal sinus rhythm with rate of 62 bpm, left atrial enlargement, normal axis.  Anteroseptal infarct old.  Borderline low voltage complexes.  Nonspecific T abnormality, V1 to V3 cannot exclude anterior ischemia.  No significant change since 01/13/2019.   Assessment     ICD-10-CM   1. PAD (peripheral artery disease) (HCC)  I73.9   2. Essential hypertension  I10 CBC    CMP14+EGFR  3. Tobacco use disorder  F17.200   4. Dyspnea on exertion  R06.00 PCV ECHOCARDIOGRAM COMPLETE  5. Hypercholesteremia  E78.00 Lipid Panel With LDL/HDL Ratio    No orders of the defined types were placed in this encounter.  There are no discontinued medications.  Recommendations:   Patrick Aguirre  is a 59 y.o. AAM with chronic tobacco use, essential hypertension, elevated LFTs felt to be due to statins and also hypokalemia and unable to tolerate high-dose ACE inhibitor due to hyperkalemia and chronic back pain s/p L4-5 microdisectomy in Feb 2018. Patient is S/P right common iliac artery stenting on 12/05/2017, with improvement in symptoms of right hip claudication.  He still has pseudoclaudication/neurogenic claudication left hip and back pain.  This is a 66-monthoffice visit. He is having only mild claudication symptoms, his vascular physical examination remains unchanged. He is on appropriate medical therapy.   His blood pressure is well controlled on his current regimen. He is  presently on low dose Crestor and his LDL has been well controlled. The patient has fatty liver and history of elevated LFTs which is likely related to alcohol use. He still drinks 3-4 beers each evening, but wants to quit completely.   The patient had previously quit smoking last year, but  currently is smoking 4 cigarettes per day. He still plans to quit completely. In addition to smoking cessation and alcohol cessation, the patient is encouraged to work on weight loss. We discussed dietary changes including reducing red meat consumption and low sodium diet.   Regarding his dyspnea on exertion, I have ordered an echocardiogram to evaluate cardiac function. He had a low risk stress test in 2019, however he has never had an echocardiogram previously. I will also recheck his labs including lipids, CMP, and a CBC.  I will see him back in clinic in 6 months for follow up.   Blair Heys, PA Student 02/08/20 10:29 AM  Patient seen and examined in conjunction with Blair Heys, PA second year student at [employer].  Time spent is in direct patient face to face encounter not including the teaching and training involved.    Adrian Prows, MD, [facility] 02/08/2020, 10:29 AM Office: [phone]

## 2020-02-18 LAB — CMP14+EGFR
ALT: 43 IU/L (ref 0–44)
AST: 56 IU/L — ABNORMAL HIGH (ref 0–40)
Albumin/Globulin Ratio: 1.4 (ref 1.2–2.2)
Albumin: 4.4 g/dL (ref 3.8–4.8)
Alkaline Phosphatase: 83 IU/L (ref 44–121)
BUN/Creatinine Ratio: 7 — ABNORMAL LOW (ref 10–24)
BUN: 5 mg/dL — ABNORMAL LOW (ref 8–27)
Bilirubin Total: 0.4 mg/dL (ref 0.0–1.2)
CO2: 19 mmol/L — ABNORMAL LOW (ref 20–29)
Calcium: 9.3 mg/dL (ref 8.6–10.2)
Chloride: 94 mmol/L — ABNORMAL LOW (ref 96–106)
Creatinine, Ser: 0.7 mg/dL — ABNORMAL LOW (ref 0.76–1.27)
GFR calc Af Amer: 118 mL/min/{1.73_m2} (ref >59)
GFR calc non Af Amer: 102 mL/min/{1.73_m2} (ref >59)
Globulin, Total: 3.2 g/dL (ref 1.5–4.5)
Glucose: 85 mg/dL (ref 65–99)
Potassium: 4.5 mmol/L (ref 3.5–5.2)
Sodium: 130 mmol/L — ABNORMAL LOW (ref 134–144)
Total Protein: 7.6 g/dL (ref 6.0–8.5)

## 2020-02-18 LAB — LIPID PANEL WITH LDL/HDL RATIO
Cholesterol, Total: 121 mg/dL (ref 100–199)
HDL: 51 mg/dL (ref >39)
LDL Chol Calc (NIH): 60 mg/dL (ref 0–99)
LDL/HDL Ratio: 1.2 ratio (ref 0.0–3.6)
Triglycerides: 39 mg/dL (ref 0–149)
VLDL Cholesterol Cal: 10 mg/dL (ref 5–40)

## 2020-02-18 LAB — CBC
Hematocrit: 38.6 % (ref 37.5–51.0)
Hemoglobin: 13.2 g/dL (ref 13.0–17.7)
MCH: 31.6 pg (ref 26.6–33.0)
MCHC: 34.2 g/dL (ref 31.5–35.7)
MCV: 92 fL (ref 79–97)
Platelets: 348 10*3/uL (ref 150–450)
RBC: 4.18 x10E6/uL (ref 4.14–5.80)
RDW: 12 % (ref 11.6–15.4)
WBC: 7.1 10*3/uL (ref 3.4–10.8)

## 2020-02-20 NOTE — Progress Notes (Signed)
Left vm to cb.

## 2020-02-24 NOTE — Progress Notes (Signed)
Called pt to inform him about his lab results and to stop the Lisinopril. Pt understood.

## 2020-05-29 ENCOUNTER — Other Ambulatory Visit: Payer: Self-pay

## 2020-05-29 DIAGNOSIS — I10 Essential (primary) hypertension: Secondary | ICD-10-CM

## 2020-05-29 MED ORDER — LISINOPRIL 5 MG PO TABS: 5.0000 mg | ORAL_TABLET | Freq: Every day | ORAL | 1 refills | Status: DC

## 2020-05-29 MED ORDER — AMLODIPINE BESYLATE 5 MG PO TABS: 5.0000 mg | ORAL_TABLET | Freq: Every day | ORAL | 1 refills | Status: DC

## 2020-05-29 MED ORDER — ROSUVASTATIN CALCIUM 5 MG PO TABS: 5.0000 mg | ORAL_TABLET | Freq: Every day | ORAL | 1 refills | Status: DC

## 2020-07-21 ENCOUNTER — Other Ambulatory Visit: Payer: Medicaid Other

## 2020-07-27 ENCOUNTER — Ambulatory Visit: Payer: Medicaid Other

## 2020-07-27 ENCOUNTER — Other Ambulatory Visit: Payer: Self-pay

## 2020-07-27 DIAGNOSIS — R0609 Other forms of dyspnea: Secondary | ICD-10-CM

## 2020-07-27 DIAGNOSIS — R0600 Dyspnea, unspecified: Secondary | ICD-10-CM

## 2020-07-31 IMAGING — DX DG FINGER THUMB 2+V*L*
3 series · 3 of 3 positions shown · non-contrast
Comparison: None.

CLINICAL DATA: Blister on left thumb

EXAM:
LEFT THUMB 2+V

[finger ap]
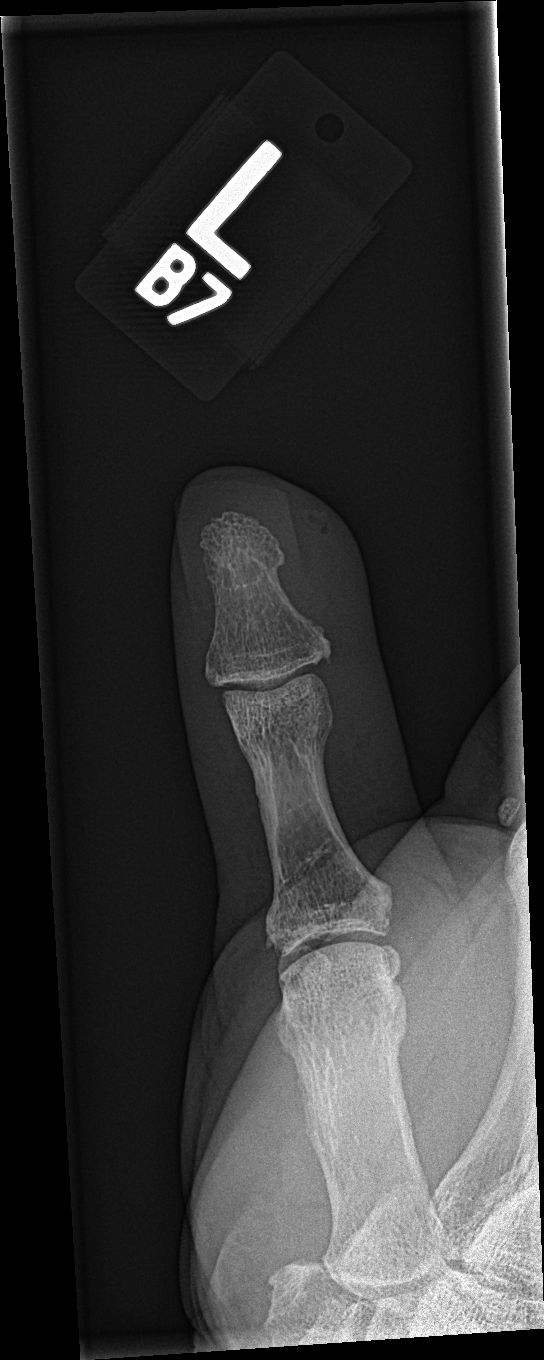

[finger obl]
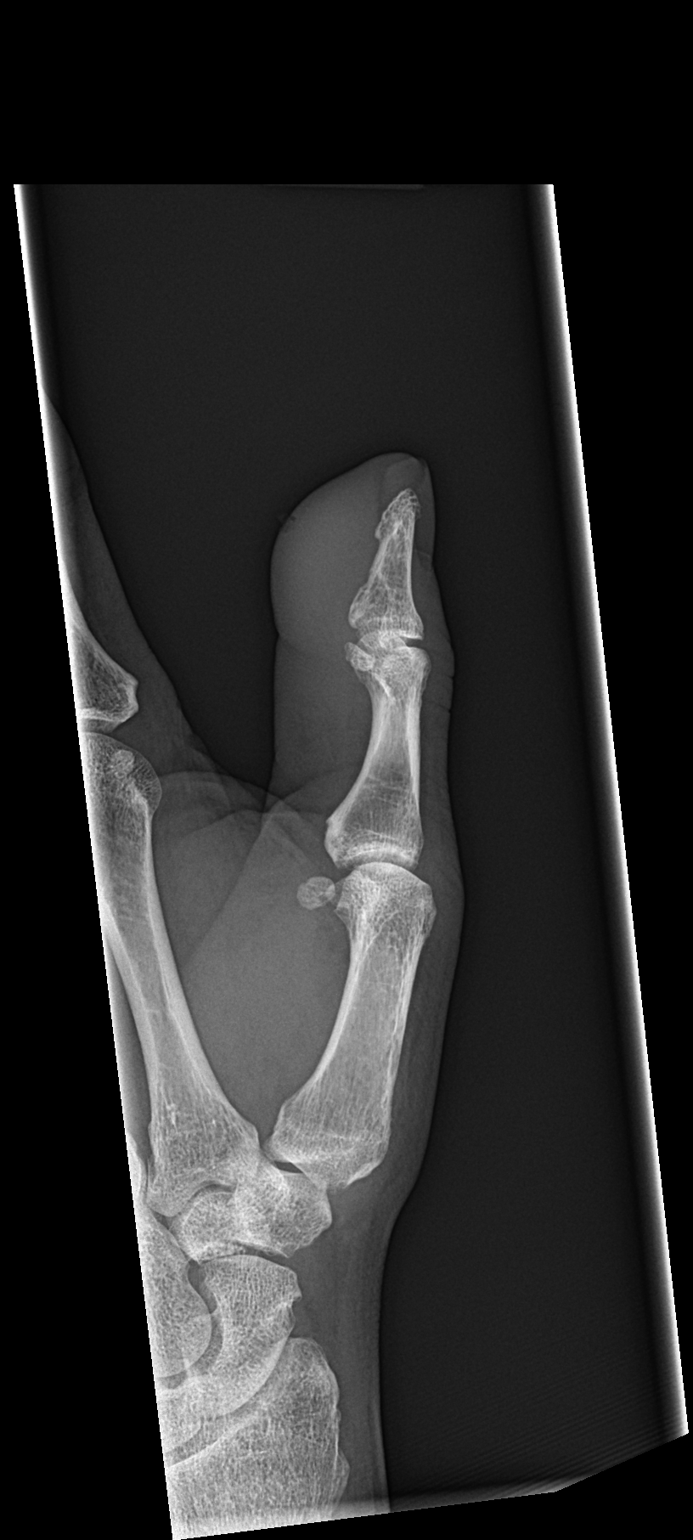

[finger lat]
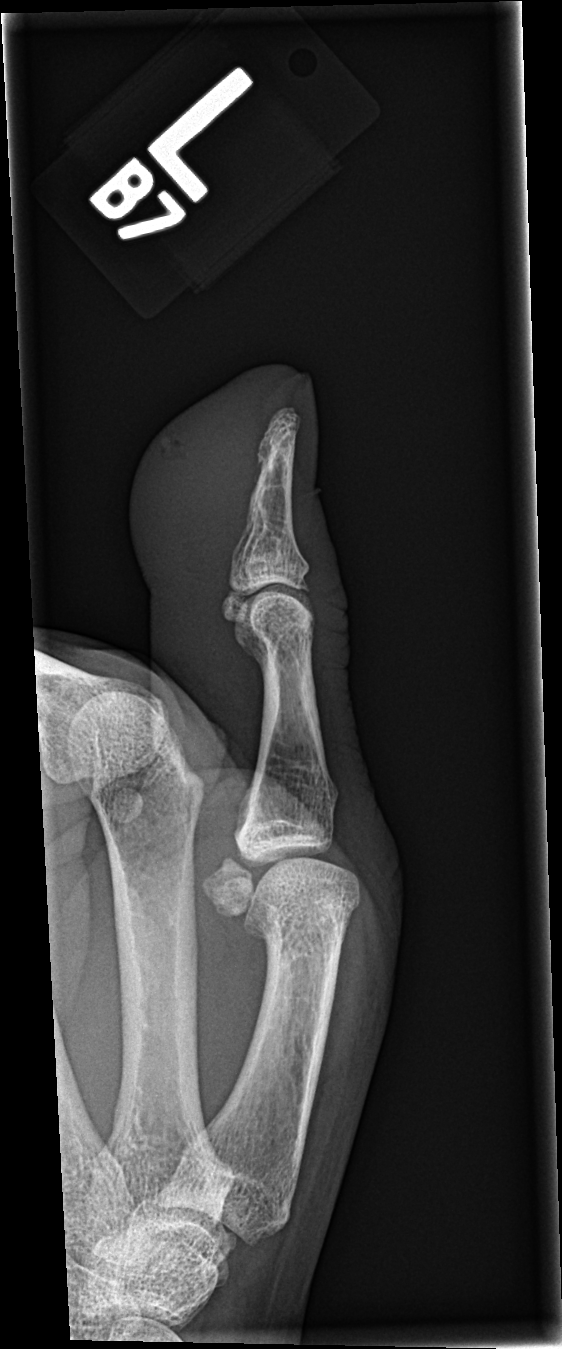

[3 of 3 positions shown; findings below may reference images not displayed]

FINDINGS: Soft tissue swelling at the tip of the left thumb. Small ulceration
noted at the skin surface. No underlying bony abnormality. No
radiographic changes of osteomyelitis. No radiopaque foreign bodies.
IMPRESSION: Soft tissue swelling without evidence of osteomyelitis or radiopaque
foreign body.

## 2020-08-05 NOTE — Progress Notes (Signed)
Primary Physician/Referring:  Suzan Slick, MD  Patient ID: Patrick Aguirre, male    DOB: 1961/03/17, 60 y.o.   MRN: [id number]  Chief Complaint  Patient presents with  . Follow-up    6 month  . Hypertension  . PAD  . HLD   HPI:    Patrick Aguirre  is a 60 y.o. AAM with chronic tobacco use quit, essential hypertension, elevated LFTs felt to be due to statins and also hypokalemia and unable to tolerate high-dose ACE inhibitor due to hyperkalemia and chronic back pain s/p L4-5 microdisectomy in Feb 2018. Patient is S/P right common iliac artery stenting on 12/05/2017, with improvement in symptoms of right hip claudication.  He still has pseudoclaudication/neurogenic claudication.  Patient presents for 62-month follow-up of hypertension, hyperlipidemia, and PAD.  He continues to have mild stable symptoms of claudication, only when he is walking uphill.  Patient has increased his physical activity, reportedly walking approximately 2 miles each day.  Denies chest pain, palpitations, leg swelling, syncope, near syncope.  He does have dyspnea on exertion which is also chronic but he reports this has improved as he is slowly increase his physical activity.  Unfortunately patient continues to smoke approximately half a pack per day.  He also continues to drink 3-4 beers per night.  He monitors his blood pressure on a daily basis at home, reporting readings 120-135/60s mmHg.  Patient also admits to occasional diet noncompliance, and notes elevated blood pressures when he eats salty or spicy food.  Past Medical History:  Diagnosis Date  . Arthritis   . Chronic back pain   . Environmental allergies   . Hypertension    Past Surgical History:  Procedure Laterality Date  . LOWER EXTREMITY ANGIOGRAPHY N/A 12/05/2017   Procedure: LOWER EXTREMITY ANGIOGRAPHY;  Surgeon: Yates Decamp, MD;  Location: [facility] INVASIVE CV [facility];  Service: Cardiovascular;  Laterality: N/A;  . LUMBAR LAMINECTOMY/DECOMPRESSION  MICRODISCECTOMY N/A 07/20/2016   Procedure: Right L4-5 Microdiscectomy;  Surgeon: Eldred Manges, MD;  Location: [facility] OR;  Service: Orthopedics;  Laterality: N/A;  . NO PAST SURGERIES    . PERIPHERAL VASCULAR ATHERECTOMY  12/05/2017   Procedure: PERIPHERAL VASCULAR ATHERECTOMY;  Surgeon: Yates Decamp, MD;  Location: [facility] INVASIVE CV LAB;  Service: Cardiovascular;;  . PERIPHERAL VASCULAR INTERVENTION  12/05/2017   Procedure: PERIPHERAL VASCULAR INTERVENTION;  Surgeon: Yates Decamp, MD;  Location: [facility] IN[facility]AS[facility] CV [facility];  Service: Cardiovascular;;   Family History  Problem Relation Age of Onset  . Diabetes Mother   . Hypertension Mother   . Heart disease Sister     Social History   Tobacco Use  . Smoking status: Former Smoker    Packs/day: 0.50    Types: Cigarettes    Quit date: 05/20/2019    Years since quitting: 1.2  . Smokeless tobacco: Never Used  Substance Use Topics  . Alcohol use: Yes    Comment: 2 beers daily    Marital Status: Married  ROS   Review of Systems  Constitutional: Negative for malaise/fatigue and weight gain.  Cardiovascular: Positive for claudication (stable) and dyspnea on exertion (chronic, stable). Negative for chest pain, leg swelling, near-syncope, orthopnea, palpitations, paroxysmal nocturnal dyspnea and syncope.  Hematologic/Lymphatic: Does not bruise/bleed easily.  Musculoskeletal: Positive for back pain and joint pain (right hip).  Gastrointestinal: Negative for melena.  Neurological: Negative for dizziness and weakness.   Objective   Vitals:   08/06/20 0853 08/06/20 0907  BP: (!) 179/72 135/65  Pulse: 94 88  Resp:  17   Temp: 98.2 F (36.8 C)   SpO2: 97% 99%   Vitals with BMI 08/06/2020 08/06/2020 02/06/2020  Height - 5\' 11"  5\' 11"   Weight - 227 lbs 6 oz 234 lbs  BMI - 31.73 32.65  Systolic 135 179 161130  Diastolic 65 72 64  Pulse 88 94 78     Physical Exam Vitals reviewed.  Constitutional:      Appearance: [name]  HENT:     Head:  Normocephalic and atraumatic.  Neck:     Vascular: No carotid bruit or JVD.  Cardiovascular:     Rate and Rhythm: Normal rate and regular rhythm.     Pulses: Intact distal pulses.          Carotid pulses are 2+ on the right side and 2+ on the left side.      Radial pulses are 2+ on the right side and 2+ on the left side.       Femoral pulses are 1+ on the right side and 1+ on the left side.      Popliteal pulses are 0 on the right side and 2+ on the left side.       Dorsalis pedis pulses are 0 on the right side and 1+ on the left side.       Posterior tibial pulses are 2+ on the right side and 2+ on the left side.     Heart sounds: S1 normal and S2 normal. No murmur heard. No gallop.   Pulmonary:     Effort: Pulmonary effort is normal. No respiratory distress.     Breath sounds: No wheezing, rhonchi or rales.  Abdominal:     General: Bowel sounds are normal. There is no distension.     Palpations: Abdomen is soft.  Musculoskeletal:     Right lower leg: No edema.     Left lower leg: No edema.  Skin:    General: Skin is warm and dry.     Capillary Refill: Capillary refill takes less than 2 seconds.     Findings: No erythema or lesion.  Neurological:     Mental Status: He is alert.    Laboratory examination:   Recent Labs    02/17/20 0817  NA 130*  K 4.5  CL 94*  CO2 19*  GLUCOSE 85  BUN 5*  CREATININE 0.70*  CALCIUM 9.3  GFRNONAA 102  GFRAA 118   CrCl cannot be calculated (Patient's most recent lab result is older than the maximum 21 days allowed.).  CMP Latest Ref Rng & Units 02/17/2020 06/04/2019 03/19/2019  Glucose 65 - 99 mg/dL 85 096(E101(H) 91  BUN 8 - 27 mg/dL 5(L) 15 8  Creatinine 4.540.76 - 1.27 mg/dL 0.98(J0.70(L) 1.910.95 4.780.70  Sodium 134 - 144 mmol/L 130(L) 130(L) 137  Potassium 3.5 - 5.2 mmol/L 4.5 3.9 3.9  Chloride 96 - 106 mmol/L 94(L) 98 105  CO2 20 - 29 mmol/L 19(L) 21(L) -  Calcium 8.6 - 10.2 mg/dL 9.3 2.9(F8.8(L) -  Total Protein 6.0 - 8.5 g/dL 7.6 8.0 -  Total  Bilirubin 0.0 - 1.2 mg/dL 0.4 1.0 -  Alkaline Phos 44 - 121 IU/L 83 69 -  AST 0 - 40 IU/L 56(H) 50(H) -  ALT 0 - 44 IU/L 43 39 -   CBC Latest Ref Rng & Units 02/17/2020 06/04/2019 03/19/2019  WBC 3.4 - 10.8 x10E3/uL 7.1 9.4 -  Hemoglobin 13.0 - 17.7 g/dL 62.113.2 11.4(L) 14.3  Hematocrit 37.5 -  51.0 % 38.6 35.8(L) 42.0  Platelets 150 - 450 x10E3/uL 348 327 -   Lipid Panel     Component Value Date/Time   CHOL 121 02/17/2020 0817   TRIG 39 02/17/2020 0817   HDL 51 02/17/2020 0817   LDLCALC 60 02/17/2020 0817     HEMOGLOBIN A1C No results found for: HGBA1C, MPG   TSH No results for input(s): TSH in the last 8760 hours.   External Labs:  AIC 5.0% 08/21/2019  TSH 2.690 09/08/2017  Medications and allergies  No Known Allergies   Current Outpatient Medications on File Prior to Visit  Medication Sig Dispense Refill  . aspirin EC 81 MG tablet Take 81 mg by mouth daily.    Marland Kitchen lisinopril (ZESTRIL) 5 MG tablet Take 1 tablet (5 mg total) by mouth daily. 90 tablet 1  . rosuvastatin (CRESTOR) 5 MG tablet Take 1 tablet (5 mg total) by mouth daily. 90 tablet 1   No current facility-administered medications on file prior to visit.    Radiology:   CT of the abdomen 01/07/2019: 1. Mild hepatomegaly. No evidence of liver mass or other hepatic abnormality. 2. Cholelithiasis. No radiographic evidence of cholecystitis. 3. Aortic Atherosclerosis (ICD10-I70.0).  Ultrasound of the abdomen 12/24/2018: Normal.  Cardiac Studies:   Lexiscan myoview stress test 09/18/2017: 1. The resting electrocardiogram demonstrated normal sinus rhythm, poor R progression, normal resting conduction, no resting arrhythmias and non-specific T changes. Stress EKG is non-diagnostic for ischemia as it a pharmacologic stress using Lexiscan. The patient developed significant symptoms which included Flushing. 2. LV is dilated both at rest and stress images. The LV end diastolic volume was 148 mL. Stress and rest SPECT  images demonstrate homogeneous tracer distribution throughout the myocardium. Gated SPECT imaging reveals normal myocardial thickening and wall motion. The left ventricular ejection fraction was low normal (53%). This is a low risk study.  Carotid artery duplex 04/09/2018: No hemodynamically significant arterial disease in the internal carotid artery bilaterally. Very mild heterogeneous plaque noted bilaterally. Antegrade right vertebral artery flow. Antegrade left vertebral artery flow.  Peripheral arteriogram 12/05/2017: CSI atherectomy followed by stenting of right CIA with 10.0 x 39 mm Viabhan VBX, 100% to 0%. Left CIA 50% stenosis without gradient.  Lower Extremity Arterial Duplex 05/22/2019:  No hemodynamically significant stenoses are identified in the right lower extremity arterial system. No hemodynamically significant stenoses are identified in the left lower extremity arterial system.  This exam reveals mildly decreased perfusion of the right lower extremity, noted at the dorsalis pedis artery level (ABI 0.96) and mildly decreased  perfusion of the left lower extremity, noted at the post tibial artery level (ABI 0.96).  Compared to the study done on 11/21/2017, markedly abnormal monophasic waveform throughout the right lower extremity is no longer present, right  ABI is improved from 0.52.  This suggest patency of right iliac artery stent.  Abdominal Aortic Duplex 06/03/2019:  The maximum aorta (sac) diameter is 1.96 cm (dist). No evidence of atherosclerotic plaque. Normal flow velocities noted in the aorta and iliac arteries. Diffuse aortic atherosclerosis.  PCV ECHOCARDIOGRAM COMPLETE 07/27/2020 Left ventricle cavity is normal in size and wall thickness. Normal global wall motion. Normal LV systolic function with EF 68%. Normal diastolic filling pattern. No significant valvular abnormality. Normal right atrial pressure.   EKG   EKG 08/06/2020: Sinus rhythm 83 bpm.  Left atrial  enlargement.  Normal axis.  Poor R wave progression, cannot exclude anteroseptal infarct old.  Nonspecific T wave abnormality.  Compared to  EKG 10/16/2019 no significant change.  EKG 10/15/2009: Normal sinus rhythm with rate of 62 bpm, left atrial enlargement, normal axis.  Anteroseptal infarct old.  Borderline low voltage complexes.  Nonspecific T abnormality, V1 to V3 cannot exclude anterior ischemia.  No significant change since 01/13/2019.   Assessment     ICD-10-CM   1. PAD (peripheral artery disease) (HCC)  I73.9   2. Essential hypertension  I10 EKG 12-Lead    amLODipine (NORVASC) 5 MG tablet  3. Hypercholesteremia  E78.00   4. Tobacco use disorder  F17.200   5. Alcohol use  Z72.89   6. Dyspnea on exertion  R06.00     Meds ordered this encounter  Medications  . amLODipine (NORVASC) 5 MG tablet    Sig: Take 2 tablets (10 mg total) by mouth daily.    Dispense:  180 tablet    Refill:  3   Medications Discontinued During This Encounter  Medication Reason  . amLODipine (NORVASC) 5 MG tablet Reorder    Recommendations:   Patrick Aguirre  is a 60 y.o. AAM with chronic tobacco use, essential hypertension, elevated LFTs felt to be due to statins and also hypokalemia and unable to tolerate high-dose ACE inhibitor due to hyperkalemia and chronic back pain s/p L4-5 microdisectomy in Feb 2018. Patient is S/P right common iliac artery stenting on 12/05/2017, with improvement in symptoms of right hip claudication.  He still has pseudoclaudication/neurogenic claudication left hip and back pain.  Patient presents for 6 month follow up. Reviewed and discussed results of echocardiogram with patient, details above. Echocardiogram revealed normal LVEF without significant abnormality.  Patient symptoms of claudication are only mild, and are stable.  There are no changes in his physical exam today.  Review of recent lipid profile testing shows lipids are well controlled.  We will continue aspirin and  low-dose Crestor.  In regard to dyspnea on exertion, patient continues to have mild symptoms however they have improved since previous visit as he is slowly increase his physical activity.  Encourage patient to continue to walk on a daily basis.  In regard to hypertension, patient's blood pressure is elevated in the office today, however did improve upon recheck.  Patient reports blood pressure readings that are mildly elevated above goal.  We will increase amlodipine from 5 mg to 10 mg daily.  Patient will notify our office if he experiences any side effects, or symptoms of hypotension or bradycardia. In view of low sodium, patient had previously been advised to stop lisinopril, however, patient states he may still be taking. He will notify our office when he gets home if he is still taking lisinopril. Have advised him to stop it if so and to continue to monitor his blood pressure closely.   Discussed at length the importance of smoking cessation and reduction of alcohol intake, however patient does not appear motivated to quit despite counseling regarding reduction of cardiovascular risk.  Also counseled patient regarding diet compliance and weight loss.  Notably patient has history of fatty liver and elevated LFTs, likely due to underlying alcohol use.  Follow-up in 6 months, sooner if needed, for hypertension, hyperlipidemia, and PAD.    Rayford Halsted, PA-C 08/06/2020, 10:54 AM Office: [phone]

## 2020-08-06 ENCOUNTER — Encounter: Payer: Self-pay | Admitting: Student

## 2020-08-06 ENCOUNTER — Ambulatory Visit: Payer: Medicaid Other | Admitting: Cardiology

## 2020-08-06 ENCOUNTER — Ambulatory Visit (INDEPENDENT_AMBULATORY_CARE_PROVIDER_SITE_OTHER): Payer: Medicaid Other | Admitting: Student

## 2020-08-06 ENCOUNTER — Other Ambulatory Visit: Payer: Self-pay

## 2020-08-06 VITALS — BP 135/65 | HR 88 | Temp 98.2°F | Resp 17 | Ht 71.0 in | Wt 227.4 lb

## 2020-08-06 DIAGNOSIS — F17200 Nicotine dependence, unspecified, uncomplicated: Secondary | ICD-10-CM

## 2020-08-06 DIAGNOSIS — R0600 Dyspnea, unspecified: Secondary | ICD-10-CM

## 2020-08-06 DIAGNOSIS — Z789 Other specified health status: Secondary | ICD-10-CM

## 2020-08-06 DIAGNOSIS — I739 Peripheral vascular disease, unspecified: Secondary | ICD-10-CM

## 2020-08-06 DIAGNOSIS — R0609 Other forms of dyspnea: Secondary | ICD-10-CM

## 2020-08-06 DIAGNOSIS — E7800 Pure hypercholesterolemia, unspecified: Secondary | ICD-10-CM

## 2020-08-06 DIAGNOSIS — Z7289 Other problems related to lifestyle: Secondary | ICD-10-CM

## 2020-08-06 DIAGNOSIS — I10 Essential (primary) hypertension: Secondary | ICD-10-CM

## 2020-08-06 DIAGNOSIS — F1090 Alcohol use, unspecified, uncomplicated: Secondary | ICD-10-CM

## 2020-08-06 MED ORDER — AMLODIPINE BESYLATE 5 MG PO TABS: 10.0000 mg | ORAL_TABLET | Freq: Every day | ORAL | 3 refills | Status: AC

## 2020-08-07 ENCOUNTER — Telehealth: Payer: Self-pay

## 2020-08-07 NOTE — Telephone Encounter (Signed)
-----   Message from Rayford Halsted, [place] sent at 08/06/2020 10:57 AM EDT ----- Please call patient and determine if he is still taking lisinopril. JG had advised him to stop this due to low sodium levels in September 2021. If patient is still taking it, please advise him to stop lisinopril and continue with amlodipine 10 mg daily. Please instruct him to notify our office if his home BP readings are consistently >130/80 mmHg after stopping lisinopril.

## 2020-08-07 NOTE — Telephone Encounter (Signed)
Patient was still taking Lisinopril, but I told him to stop ASAP and monitor his BP

## 2020-10-21 ENCOUNTER — Other Ambulatory Visit: Payer: Self-pay | Admitting: Cardiology

## 2020-11-11 ENCOUNTER — Encounter (INDEPENDENT_AMBULATORY_CARE_PROVIDER_SITE_OTHER): Payer: Self-pay | Admitting: *Deleted

## 2021-02-04 NOTE — Progress Notes (Signed)
Primary Physician/Referring:  Suzan Slick, MD  Patient ID: Patrick Aguirre, male    DOB: February 20, 1961, 60 y.o.   MRN: [id number]  Chief Complaint  Patient presents with   Hyperlipidemia   PAD   Hypertension   Follow-up    6 months   HPI:    Patrick Aguirre  is a 60 y.o. AAM with chronic tobacco use quit, essential hypertension, elevated LFTs felt to be due to statins and also hypokalemia and unable to tolerate high-dose ACE inhibitor due to hyperkalemia and chronic back pain s/p L4-5 microdisectomy in Feb 2018. Patient is S/P right common iliac artery stenting on 12/05/2017, with improvement in symptoms of right hip claudication.  He still has pseudoclaudication/neurogenic claudication.  Patient presents for 62-month follow-up of hypertension, hyperlipidemia, and PAD.  At last office visit increased amlodipine from 5 mg to 10 mg daily and advised him to discontinue lisinopril as he had mistakenly continued to take it despite hyponatremia.  Patient has again mistakenly not been taking amlodipine but rather taking lisinopril 5 mg daily.  BMP check in 10/2020 by PCP revealed sodium levels to be normal.  Patient is feeling relatively well and continues to only have mild symptoms of claudication when walking uphill which is stable.  He notes he has been working on reducing alcohol intake and smoking as well as increasing his physical activity.  Unfortunately he does continue to smoke approximately half a pack per day and drink 2-3 beers 3 to 4 days/week.  Patient is enrolled in remote patient blood pressure monitoring, average home blood pressure 123/68 mmHg. Denies chest pain, palpitations, leg swelling, syncope, near syncope  Past Medical History:  Diagnosis Date   Arthritis    Chronic back pain    Environmental allergies    Hypertension    Past Surgical History:  Procedure Laterality Date   LOWER EXTREMITY ANGIOGRAPHY N/A 12/05/2017   Procedure: LOWER EXTREMITY ANGIOGRAPHY;  Surgeon: Yates Decamp, MD;  Location: [facility] INVASIVE CV LAB;  Service: Cardiovascular;  Laterality: N/A;   LUMBAR LAMINECTOMY/DECOMPRESSION MICRODISCECTOMY N/A 07/20/2016   Procedure: Right L4-5 Microdiscectomy;  Surgeon: Eldred Manges, MD;  Location: [facility];  Service: Orthopedics;  Laterality: N/A;   NO PAST SURGERIES     PERIPHERAL VASCULAR ATHERECTOMY  12/05/2017   Procedure: PERIPHERAL VASCULAR ATHERECTOMY;  Surgeon: Yates Decamp, MD;  Location: [facility] INVASIVE CV LAB;  Service: Cardiovascular;;   PERIPHERAL VASCULAR INTERVENTION  12/05/2017   Procedure: PERIPHERAL VASCULAR INTERVENTION;  Surgeon: Yates Decamp, MD;  Location: [facility] INVASIVE CV LAB;  Service: Cardiovascular;;   Family History  Problem Relation Age of Onset   Diabetes Mother    Hypertension Mother    Heart disease Sister    Social History   Tobacco Use   Smoking status: Former    Packs/day: 0.50    Types: Cigarettes    Quit date: 05/20/2019    Years since quitting: 1.7   Smokeless tobacco: Never  Substance Use Topics   Alcohol use: Yes    Comment: 2 beers daily    Marital Status: Married  ROS   Review of Systems  Constitutional: Negative for malaise/fatigue.  Cardiovascular:  Positive for claudication (stable) and dyspnea on exertion (chronic, stable). Negative for chest pain, leg swelling, near-syncope, orthopnea, palpitations, paroxysmal nocturnal dyspnea and syncope.  Musculoskeletal:  Positive for back pain and joint pain (right hip).  Neurological:  Negative for dizziness and weakness.  Objective   Vitals:   02/05/21 0857  BP: (!) 144/60  Pulse: 76  Resp: 16  Temp: (!) 96.2 F (35.7 C)  SpO2: 93%   Vitals with BMI 02/05/2021 08/06/2020 08/06/2020  Height 5\' 11"  - 5\' 11"   Weight 224 lbs - 227 lbs 6 oz  BMI 31.26 - 31.73  Systolic 144 135  Diastolic 60 65 72  Pulse 76 88 94     Physical Exam Vitals reviewed.  Constitutional:      Appearance: [name]  HENT:     Head: Normocephalic and atraumatic.  Neck:      Vascular: No carotid bruit or JVD.  Cardiovascular:     Rate and Rhythm: Normal rate and regular rhythm.     Pulses: Intact distal pulses.          Carotid pulses are 2+ on the right side and 2+ on the left side.      Radial pulses are 2+ on the right side and 2+ on the left side.       Femoral pulses are 1+ on the right side and 1+ on the left side.      Popliteal pulses are 0 on the right side and 2+ on the left side.       Dorsalis pedis pulses are 0 on the right side and 1+ on the left side.       Posterior tibial pulses are 2+ on the right side and 2+ on the left side.     Heart sounds: S1 normal and S2 normal. No murmur heard.   No gallop.  Pulmonary:     Effort: Pulmonary effort is normal. No respiratory distress.     Breath sounds: No wheezing, rhonchi or rales.  Musculoskeletal:     Right lower leg: No edema.     Left lower leg: No edema.  Skin:    General: Skin is warm and dry.     Findings: No erythema or lesion.  Neurological:     Mental Status: He is alert.   Laboratory examination:   Recent Labs    02/17/20 0817  NA 130*  K 4.5  CL 94*  CO2 19*  GLUCOSE 85  BUN 5*  CREATININE 0.70*  CALCIUM 9.3  GFRNONAA 102  GFRAA 118   CrCl cannot be calculated (Patient's most recent lab result is older than the maximum 21 days allowed.).  CMP Latest Ref Rng & Units 02/17/2020 06/04/2019 03/19/2019  Glucose 65 - 99 mg/dL 85 08/02/2019) 91  BUN 8 - 27 mg/dL 5(L) 15 8  Creatinine 03/21/2019 - 1.27 mg/dL 779(T) 9.03 0.09(Q  Sodium 134 - 144 mmol/L 130(L) 130(L) 137  Potassium 3.5 - 5.2 mmol/L 4.5 3.9 3.9  Chloride 96 - 106 mmol/L 94(L) 98 105  CO2 20 - 29 mmol/L 19(L) 21(L) -  Calcium 8.6 - 10.2 mg/dL 9.3 3.30) -  Total Protein 6.0 - 8.5 g/dL 7.6 8.0 -  Total Bilirubin 0.0 - 1.2 mg/dL 0.4 1.0 -  Alkaline Phos 44 - 121 IU/L 83 69 -  AST 0 - 40 IU/L 56(H) 50(H) -  ALT 0 - 44 IU/L 43 39 -   CBC Latest Ref Rng & Units 02/17/2020 06/04/2019 03/19/2019  WBC 3.4 - 10.8 x10E3/uL 7.1 9.4  -  Hemoglobin 13.0 - 17.7 g/dL 08/02/2019 11.4(L) 14.3  Hematocrit 37.5 - 51.0 % 38.6 35.8(L) 42.0  Platelets 150 - 450 x10E3/uL 348 327 -   Lipid Panel     Component Value Date/Time   CHOL 121 02/17/2020 0817   TRIG  39 02/17/2020 0817   HDL 51 02/17/2020 0817   LDLCALC 60 02/17/2020 0817     HEMOGLOBIN A1C No results found for: HGBA1C, MPG   TSH No results for input(s): TSH in the last 8760 hours.   External Labs: 11/05/2020: Hgb 14.2, HCT 42.1, MCV 95, platelet 211 Glucose 91, BUN 4, creatinine 0.74, GFR >60, sodium 135, potassium 4.4 A1c 4.9% Total cholesterol 188, triglycerides 49, HDL 108, LDL 70 PSA 1.3  AIC 5.0% 08/21/2019  TSH 2.690 09/08/2017 Allergies  No Known Allergies    Medications Prior to Visit:   Outpatient Medications Prior to Visit  Medication Sig Dispense Refill   aspirin EC 81 MG tablet Take 81 mg by mouth daily.     lisinopril (ZESTRIL) 5 MG tablet Take 1 tablet (5 mg total) by mouth daily. 90 tablet 1   rosuvastatin (CRESTOR) 5 MG tablet Take 1 tablet by mouth once daily 90 tablet 0   amLODipine (NORVASC) 5 MG tablet Take 2 tablets (10 mg total) by mouth daily. (Patient not taking: Reported on 02/05/2021) 180 tablet 3   No facility-administered medications prior to visit.   Final Medications at End of Visit    Current Meds  Medication Sig   aspirin EC 81 MG tablet Take 81 mg by mouth daily.   lisinopril (ZESTRIL) 5 MG tablet Take 1 tablet (5 mg total) by mouth daily.   rosuvastatin (CRESTOR) 5 MG tablet Take 1 tablet by mouth once daily   Radiology:   CT of the abdomen 01/07/2019: 1. Mild hepatomegaly. No evidence of liver mass or other hepatic abnormality. 2. Cholelithiasis. No radiographic evidence of cholecystitis. 3. Aortic Atherosclerosis (ICD10-I70.0).  Ultrasound of the abdomen 12/24/2018: Normal.  Cardiac Studies:   Lexiscan myoview stress test 09/18/2017: 1. The resting electrocardiogram demonstrated normal sinus rhythm, poor R  progression, normal resting conduction, no resting arrhythmias and non-specific T changes. Stress EKG is non-diagnostic for ischemia as it a pharmacologic stress using Lexiscan. The patient developed significant symptoms which included Flushing. 2. LV is dilated both at rest and stress images. The LV end diastolic volume was 148 mL. Stress and rest SPECT images demonstrate homogeneous tracer distribution throughout the myocardium. Gated SPECT imaging reveals normal myocardial thickening and wall motion. The left ventricular ejection fraction was low normal (53%). This is a low risk study.  Carotid artery duplex 04/09/2018: No hemodynamically significant arterial disease in the internal carotid artery bilaterally. Very mild heterogeneous plaque noted bilaterally. Antegrade right vertebral artery flow. Antegrade left vertebral artery flow.   Peripheral arteriogram 12/05/2017: CSI atherectomy followed by stenting of right CIA with 10.0 x 39 mm Viabhan VBX, 100% to 0%. Left CIA 50% stenosis without gradient.   Lower Extremity Arterial Duplex 05/22/2019:  No hemodynamically significant stenoses are identified in the right lower extremity arterial system. No hemodynamically significant stenoses are identified in the left lower extremity arterial system.  This exam reveals mildly decreased perfusion of the right lower extremity, noted at the dorsalis pedis artery level (ABI 0.96) and mildly decreased  perfusion of the left lower extremity, noted at the post tibial artery level (ABI 0.96).  Compared to the study done on 11/21/2017, markedly abnormal monophasic waveform throughout the right lower extremity is no longer present, right  ABI is improved from 0.52.  This suggest patency of right iliac artery stent.  Abdominal Aortic Duplex  06/03/2019:  The maximum aorta (sac) diameter is 1.96 cm (dist). No evidence of atherosclerotic plaque. Normal flow velocities noted in the  aorta and iliac arteries. Diffuse  aortic atherosclerosis.  PCV ECHOCARDIOGRAM COMPLETE 07/27/2020 Left ventricle cavity is normal in size and wall thickness. Normal global wall motion. Normal LV systolic function with EF 68%. Normal diastolic filling pattern. No significant valvular abnormality. Normal right atrial pressure.    EKG  02/05/2021: Sinus rhythm at a rate of 69 bpm.  Normal axis.  Left atrial enlargement.  Poor R wave progression, cannot exclude anteroseptal infarct old.  Nonspecific T wave abnormality.  EKG 08/06/2020: Sinus rhythm 83 bpm.  Left atrial enlargement.  Normal axis.  Poor R wave progression, cannot exclude anteroseptal infarct old.  Nonspecific T wave abnormality.  Compared to EKG 10/16/2019 no significant change.  EKG 10/15/2009: Normal sinus rhythm with rate of 62 bpm, left atrial enlargement, normal axis.  Anteroseptal infarct old.  Borderline low voltage complexes.  Nonspecific T abnormality, V1 to V3 cannot exclude anterior ischemia.  No significant change since 01/13/2019.   Assessment     ICD-10-CM   1. Essential hypertension  I10 Basic metabolic panel    2. PAD (peripheral artery disease) (HCC)  I73.9 EKG 12-Lead    3. Hypercholesteremia  E78.00     4. Nicotine dependence, cigarettes, uncomplicated  F17.210       No orders of the defined types were placed in this encounter.  There are no discontinued medications.   Recommendations:   Patrick Aguirre  is a 60 y.o. AAM with chronic tobacco use, essential hypertension, elevated LFTs felt to be due to statins and also hypokalemia and unable to tolerate high-dose ACE inhibitor due to hyperkalemia and chronic back pain s/p L4-5 microdisectomy in Feb 2018. Patient is S/P right common iliac artery stenting on 12/05/2017, with improvement in symptoms of right hip claudication.  He still has pseudoclaudication/neurogenic claudication left hip and back pain.  Patient presents for 77-month follow-up of hypertension, hyperlipidemia, and PAD.  At last  office visit increased amlodipine from 5 mg to 10 mg daily and advised him to discontinue lisinopril as he had mistakenly continued to take it despite hyponatremia.  Patient has again mistakenly not been taking amlodipine but rather taking lisinopril 5 mg daily.  BMP check in 10/2020 by PCP revealed sodium levels to be normal.  We will continue lisinopril 5 mg for now, and repeat BMP.  Patient is not taking amlodipine, could consider switching lisinopril to amlodipine if patient hyponatremic. Patient's EKG and physical exam are stable.  I personally reviewed external labs, lipids are under excellent control.  Again discussed at length the importance of smoking cessation. Notably patient has history of fatty liver and elevated LFTs, likely due to underlying alcohol use.   Follow-up in 6 months, sooner if needed, for hypertension, hyperlipidemia, and PAD.   Rayford Halsted, PA-C 02/05/2021, 10:02 AM Office: [phone]

## 2021-02-05 ENCOUNTER — Ambulatory Visit: Payer: Medicaid Other | Admitting: Student

## 2021-02-05 ENCOUNTER — Encounter: Payer: Self-pay | Admitting: Student

## 2021-02-05 ENCOUNTER — Other Ambulatory Visit: Payer: Self-pay

## 2021-02-05 VITALS — BP 144/60 | HR 76 | Temp 96.2°F | Resp 16 | Ht 71.0 in | Wt 224.0 lb

## 2021-02-05 DIAGNOSIS — F17210 Nicotine dependence, cigarettes, uncomplicated: Secondary | ICD-10-CM

## 2021-02-05 DIAGNOSIS — E7800 Pure hypercholesterolemia, unspecified: Secondary | ICD-10-CM

## 2021-02-05 DIAGNOSIS — I739 Peripheral vascular disease, unspecified: Secondary | ICD-10-CM

## 2021-02-05 DIAGNOSIS — I10 Essential (primary) hypertension: Secondary | ICD-10-CM

## 2021-02-17 LAB — BASIC METABOLIC PANEL
BUN/Creatinine Ratio: 8 — ABNORMAL LOW (ref 10–24)
BUN: 6 mg/dL — ABNORMAL LOW (ref 8–27)
CO2: 21 mmol/L (ref 20–29)
Calcium: 9.4 mg/dL (ref 8.6–10.2)
Chloride: 100 mmol/L (ref 96–106)
Creatinine, Ser: 0.78 mg/dL (ref 0.76–1.27)
Glucose: 88 mg/dL (ref 70–99)
Potassium: 5.1 mmol/L (ref 3.5–5.2)
Sodium: 137 mmol/L (ref 134–144)
eGFR: 101 mL/min/{1.73_m2} (ref >59)

## 2021-02-18 NOTE — Progress Notes (Signed)
Called and spoke to pt, pt voiced understanding.

## 2021-03-22 ENCOUNTER — Encounter: Payer: Self-pay | Admitting: Pharmacist

## 2021-03-22 NOTE — Progress Notes (Signed)
   CARE PLAN ENTRY  03/22/2021 Name: Patrick Aguirre MRN: [id number] DOB: Jun 28, 1960  Patrick Aguirre is enrolled in Remote Patient Monitoring/Principle Care Monitoring.  Date of Enrollment: 08/06/19 Supervising physician: Yates Decamp Indication: HTN  Remote Readings: Compliant and Avg BP: 132/75, HR:86  Next scheduled OV: 08/05/21  Pharmacist Clinical Goal(s):  Over the next 90 days, patient will demonstrate Improved medication adherence as evidenced by medication fill history Over the next 90 days, patient will demonstrate improved understanding of prescribed medications and rationale for usage as evidenced by patient teach back Over the next 90 days, patient will experience decrease in ED visits. ED visits in last 6 months = 0 Over the next 90 days, patient will not experience hospital admission. Hospital Admissions in last 6 months = 0  Interventions: Provider and Inter-disciplinary care team collaboration (see longitudinal plan of care) Comprehensive medication review performed. Discussed plans with patient for ongoing care management follow up and provided patient with direct contact information for care management team Collaboration with provider re: medication management  Patient Self Care Activities:  Self administers medications as prescribed Attends all scheduled provider appointments Performs ADL's independently Performs IADL's independently  No Known Allergies Outpatient Encounter Medications as of 03/22/2021  Medication Sig   amLODipine (NORVASC) 5 MG tablet Take 2 tablets (10 mg total) by mouth daily. (Patient not taking: Reported on 02/05/2021)   aspirin EC 81 MG tablet Take 81 mg by mouth daily.   lisinopril (ZESTRIL) 5 MG tablet Take 1 tablet (5 mg total) by mouth daily.   rosuvastatin (CRESTOR) 5 MG tablet Take 1 tablet by mouth once daily   No facility-administered encounter medications on file as of 03/22/2021.    Hypertension   BP goal is:  <130/80  Office  blood pressures are  BP Readings from Last 3 Encounters:  02/05/21 (!) 144/60  08/06/20 135/65  02/06/20 130/64    Patient is currently controlled on the following medications: Amlodipine 10 mg, lisinopril 5 mg  Patient checks BP at home daily  Patient home BP readings are ranging: 117-142/64-91  We discussed diet and exercise extensively  Plan  Continue current medications and control with diet and exercise  ______________ Visit Information SDOH (Social Determinants of Health) assessments performed: Yes.  Patrick Aguirre was given information about Principle Care Management/Remote Patient Monitoring services today including:  RPM/PCM service includes personalized support from designated clinical staff supervised by his physician, including individualized plan of care and coordination with other care providers 24/7 contact phone numbers for assistance for urgent and routine care needs. Standard insurance, coinsurance, copays and deductibles apply for principle care management only during months in which we provide at least 30 minutes of these services. Most insurances cover these services at 100%, however patients may be responsible for any copay, coinsurance and/or deductible if applicable. This service may help you avoid the need for more expensive face-to-face services. Only one practitioner may furnish and bill the service in a calendar month. The patient may stop PCM/RPM services at any time (effective at the end of the month) by phone call to the office staff.  Patient agreed to services and verbal consent obtained.   Cassell Clement, Pharm.D. Clinical Pharmacist [facility] Cardiovascular [phone] Ext: 120

## 2021-08-03 NOTE — Progress Notes (Signed)
? ?Primary Physician/Referring:  Suzan Slick, MD ? ?Patient ID: Patrick Aguirre, male    DOB: 08-11-1960, 61 y.o.   MRN: [id number] ? ?Chief Complaint  ?Patient presents with  ? Follow-up  ? Hypertension  ? hld  ?  6 month  ? ?HPI:   ? ?Patrick Aguirre  is a 61 y.o. AAM with chronic tobacco use quit, essential hypertension, elevated LFTs felt to be due to statins and also hypokalemia and unable to tolerate high-dose ACE inhibitor due to hyperkalemia and chronic back pain s/p L4-5 microdisectomy in Feb 2018. Patient is S/P right common iliac artery stenting on 12/05/2017, with improvement in symptoms of right hip claudication.  He still has pseudoclaudication/neurogenic claudication. ? ?Patient presents for 38-month follow-up.  Last office visit patient was stable from a cardiovascular standpoint and no changes were made.  Patient is feeling well overall without specific complaints.  He does have mild symptoms of claudication when walking uphill but otherwise he walks 2 miles 7 days/week without issue.  He unfortunately continues to smoke <half a pack per day, but has not motivated to quit at this time.  He also continues to drink 2-3 beers 3 to 4 days/week.  Patient is enrolled in remote patient monitoring and although his blood pressure is elevated in the office today it is typically well controlled on home monitoring. ? ?Denies chest pain, worsening dyspnea, syncope, near syncope. ? ?Past Medical History:  ?Diagnosis Date  ? Arthritis   ? Chronic back pain   ? Environmental allergies   ? Hypertension   ? ?Past Surgical History:  ?Procedure Laterality Date  ? COLONOSCOPY  06/2021  ? LOWER EXTREMITY ANGIOGRAPHY N/A 12/05/2017  ? Procedure: LOWER EXTREMITY ANGIOGRAPHY;  Surgeon: Yates Decamp, MD;  Location: [facility] INVASIVE CV LAB;  Service: Cardiovascular;  Laterality: N/A;  ? LUMBAR LAMINECTOMY/DECOMPRESSION MICRODISCECTOMY N/A 07/20/2016  ? Procedure: Right L4-5 Microdiscectomy;  Surgeon: Eldred Manges, MD;  Location: [facility];   Service: Orthopedics;  Laterality: N/A;  ? NO PAST SURGERIES    ? PERIPHERAL VASCULAR ATHERECTOMY  12/05/2017  ? Procedure: PERIPHERAL VASCULAR ATHERECTOMY;  Surgeon: Yates Decamp, MD;  Location: [facility] INVASIVE CV LAB;  Service: Cardiovascular;;  ? PERIPHERAL VASCULAR INTERVENTION  12/05/2017  ? Procedure: PERIPHERAL VASCULAR INTERVENTION;  Surgeon: Yates Decamp, MD;  Location: [facility] INVASIVE CV LAB;  Service: Cardiovascular;;  ? ?Family History  ?Problem Relation Age of Onset  ? Diabetes Mother   ? Hypertension Mother   ? Heart disease Sister   ? ?Social History  ? ?Tobacco Use  ? Smoking status: Former  ?  Packs/day: 0.50  ?  Types: Cigarettes  ?  Quit date: 05/20/2019  ?  Years since quitting: 2.2  ? Smokeless tobacco: Never  ?Substance Use Topics  ? Alcohol use: Yes  ?  Comment: 2 beers daily  ?  ?Marital Status: Married  ?ROS  ? ?Review of Systems  ?Constitutional: Negative for malaise/fatigue.  ?Cardiovascular:  Positive for claudication (stable) and dyspnea on exertion (chronic, stable). Negative for chest pain, leg swelling, near-syncope, orthopnea, palpitations, paroxysmal nocturnal dyspnea and syncope.  ?Musculoskeletal:  Positive for back pain and joint pain (right hip).  ?Neurological:  Negative for dizziness and weakness.  ?Objective  ? ?Vitals:  ? 08/05/21 0859  ?BP: 140/70  ?Pulse: 82  ?Resp: 17  ?Temp: (!) 97.1 ?F (36.2 ?C)  ?SpO2: 96%  ? ?Vitals with BMI 08/05/2021 02/05/2021 08/06/2020  ?Height 5\' 11"  5\' 11"  -  ?Weight 232 lbs 13 oz  224 lbs -  ?BMI 32.48 31.26 -  ?Systolic 140 144 622  ?Diastolic 70 60 65  ?Pulse 82 76 88  ?   ?Physical Exam ?Vitals reviewed.  ?Constitutional:   ?   Appearance: [name]  ?Neck:  ?   Vascular: No carotid bruit or JVD.  ?Cardiovascular:  ?   Rate and Rhythm: Normal rate and regular rhythm.  ?   Pulses: Intact distal pulses.     ?     Carotid pulses are 2+ on the right side and 2+ on the left side. ?     Radial pulses are 2+ on the right side and 2+ on the left side.  ?      Femoral pulses are 1+ on the right side and 1+ on the left side. ?     Popliteal pulses are 0 on the right side and 2+ on the left side.  ?     Dorsalis pedis pulses are 0 on the right side and 1+ on the left side.  ?     Posterior tibial pulses are 2+ on the right side and 2+ on the left side.  ?   Heart sounds: S1 normal and S2 normal. No murmur heard. ?  No gallop.  ?Pulmonary:  ?   Effort: Pulmonary effort is normal. No respiratory distress.  ?   Breath sounds: No wheezing, rhonchi or rales.  ?Musculoskeletal:  ?   Right lower leg: No edema.  ?   Left lower leg: No edema.  ?Skin: ?   General: Skin is warm and dry.  ?   Findings: No erythema or lesion.  ?Neurological:  ?   Mental Status: He is alert.  ?Physical exam unchanged compared to previous office visit ? ?Laboratory examination:  ? ?Recent Labs  ?  02/16/21 ?0836  ?NA 137  ?K 5.1  ?CL 100  ?CO2 21  ?GLUCOSE 88  ?BUN 6*  ?CREATININE 0.78  ?CALCIUM 9.4  ? ?CrCl cannot be calculated (Patient's most recent lab result is older than the maximum 21 days allowed.).  ?CMP Latest Ref Rng & Units 02/16/2021 02/17/2020 06/04/2019  ?Glucose 70 - 99 mg/dL 88 85 297(L)  ?BUN 8 - 27 mg/dL 6(L) 5(L) 15  ?Creatinine 0.76 - 1.27 mg/dL 8.92 1.19(E) 1.74  ?Sodium 134 - 144 mmol/L 137 130(L) 130(L)  ?Potassium 3.5 - 5.2 mmol/L 5.1 4.5 3.9  ?Chloride 96 - 106 mmol/L 100 94(L) 98  ?CO2 20 - 29 mmol/L 21 19(L) 21(L)  ?Calcium 8.6 - 10.2 mg/dL 9.4 9.3 0.8(X)  ?Total Protein 6.0 - 8.5 g/dL - 7.6 8.0  ?Total Bilirubin 0.0 - 1.2 mg/dL - 0.4 1.0  ?Alkaline Phos 44 - 121 IU/L - 83 69  ?AST 0 - 40 IU/L - 56(H) 50(H)  ?ALT 0 - 44 IU/L - 43 39  ? ?CBC Latest Ref Rng & Units 02/17/2020 06/04/2019 03/19/2019  ?WBC 3.4 - 10.8 x10E3/uL 7.1 9.4 -  ?Hemoglobin 13.0 - 17.7 g/dL 44.8 11.4(L) 14.3  ?Hematocrit 37.5 - 51.0 % 38.6 35.8(L) 42.0  ?Platelets 150 - 450 x10E3/uL 348 327 -  ? ?Lipid Panel  ?   ?Component Value Date/Time  ? CHOL 121 02/17/2020 0817  ? TRIG 39 02/17/2020 0817  ? HDL 51 02/17/2020  0817  ? LDLCALC 60 02/17/2020 0817  ? ? ? ?HEMOGLOBIN A1C ?No results found for: HGBA1C, MPG  ? ?TSH ?No results for input(s): TSH in the last 8760 hours.  ? ?External Labs: ?  02/16/2021: ?BUN 6, creatinine 0.78, GFR >60, potassium 5.1, ?Total cholesterol 121, HDL 51, LDL 60, triglycerides 39 ?Hgb 13.2 ? ?11/05/2020: ?Hgb 14.2, HCT 42.1, MCV 95, platelet 211 ?Glucose 91, BUN 4, creatinine 0.74, GFR >60, sodium 135, potassium 4.4 ?A1c 4.9% ?Total cholesterol 188, triglycerides 49, HDL 108, LDL 70 ?PSA 1.3 ? ?AIC 5.0% 08/21/2019 ? ?TSH 2.690 09/08/2017 ? ?Allergies  ?No Known Allergies  ? ?Medications Prior to Visit:  ? ?Outpatient Medications Prior to Visit  ?Medication Sig Dispense Refill  ? amLODipine (NORVASC) 5 MG tablet Take 2 tablets (10 mg total) by mouth daily. 180 tablet 3  ? aspirin EC 81 MG tablet Take 81 mg by mouth daily.    ? lisinopril (ZESTRIL) 5 MG tablet Take 1 tablet (5 mg total) by mouth daily. 90 tablet 1  ? rosuvastatin (CRESTOR) 5 MG tablet Take 1 tablet by mouth once daily (Patient not taking: Reported on 08/05/2021) 90 tablet 0  ? ?No facility-administered medications prior to visit.  ? ?Final Medications at End of Visit   ? ?Current Meds  ?Medication Sig  ? amLODipine (NORVASC) 5 MG tablet Take 2 tablets (10 mg total) by mouth daily.  ? aspirin EC 81 MG tablet Take 81 mg by mouth daily.  ? lisinopril (ZESTRIL) 5 MG tablet Take 1 tablet (5 mg total) by mouth daily.  ? ?Radiology:  ? ?CT of the abdomen 01/07/2019: ?1. Mild hepatomegaly. No evidence of liver mass or other hepatic abnormality. ?2. Cholelithiasis. No radiographic evidence of cholecystitis. ?3. Aortic Atherosclerosis (ICD10-I70.0). ? ?Ultrasound of the abdomen 12/24/2018: Normal. ? ?Cardiac Studies:  ? ?Lexiscan myoview stress test 09/18/2017: ?1. The resting electrocardiogram demonstrated normal sinus rhythm, poor R progression, normal resting conduction, no resting arrhythmias and non-specific T changes. Stress EKG is non-diagnostic for  ischemia as it a pharmacologic stress using Lexiscan. The patient developed significant symptoms which included Flushing. ?2. LV is dilated both at rest and stress images. The LV end diastolic volum

## 2021-08-05 ENCOUNTER — Other Ambulatory Visit: Payer: Self-pay

## 2021-08-05 ENCOUNTER — Encounter: Payer: Self-pay | Admitting: Student

## 2021-08-05 ENCOUNTER — Ambulatory Visit: Payer: Medicaid Other | Admitting: Student

## 2021-08-05 VITALS — BP 140/70 | HR 82 | Temp 97.1°F | Resp 17 | Ht 71.0 in | Wt 232.8 lb

## 2021-08-05 DIAGNOSIS — I739 Peripheral vascular disease, unspecified: Secondary | ICD-10-CM

## 2021-08-05 DIAGNOSIS — E7800 Pure hypercholesterolemia, unspecified: Secondary | ICD-10-CM

## 2021-08-05 DIAGNOSIS — I10 Essential (primary) hypertension: Secondary | ICD-10-CM

## 2021-08-05 MED ORDER — ROSUVASTATIN CALCIUM 5 MG PO TABS: 5.0000 mg | ORAL_TABLET | Freq: Every day | ORAL | 3 refills | Status: AC

## 2022-08-23 ENCOUNTER — Emergency Department (HOSPITAL_COMMUNITY): Payer: Medicaid Other

## 2022-08-23 ENCOUNTER — Encounter (HOSPITAL_COMMUNITY): Payer: Self-pay

## 2022-08-23 ENCOUNTER — Other Ambulatory Visit: Payer: Self-pay

## 2022-08-23 ENCOUNTER — Emergency Department (HOSPITAL_COMMUNITY)
Admission: EM | Admit: 2022-08-23 | Discharge: 2022-08-23 | Disposition: A | Discharge status: Home / Self Care | Payer: Medicaid Other | Attending: Emergency Medicine | Admitting: Emergency Medicine

## 2022-08-23 DIAGNOSIS — I10 Essential (primary) hypertension: Secondary | ICD-10-CM | POA: Insufficient documentation

## 2022-08-23 DIAGNOSIS — Z1152 Encounter for screening for COVID-19: Secondary | ICD-10-CM | POA: Insufficient documentation

## 2022-08-23 DIAGNOSIS — Z7982 Long term (current) use of aspirin: Secondary | ICD-10-CM | POA: Insufficient documentation

## 2022-08-23 DIAGNOSIS — T783XXA Angioneurotic edema, initial encounter: Secondary | ICD-10-CM | POA: Diagnosis not present

## 2022-08-23 LAB — URINALYSIS, ROUTINE W REFLEX MICROSCOPIC
Bilirubin Urine: NEGATIVE
Glucose, UA: NEGATIVE mg/dL
Hgb urine dipstick: NEGATIVE
Ketones, ur: NEGATIVE mg/dL
Leukocytes,Ua: NEGATIVE
Nitrite: NEGATIVE
Protein, ur: NEGATIVE mg/dL
Specific Gravity, Urine: 1.006 (ref 1.005–1.030)
pH: 6 (ref 5.0–8.0)

## 2022-08-23 LAB — CBC WITH DIFFERENTIAL/PLATELET
Abs Immature Granulocytes: 0.08 10*3/uL — ABNORMAL HIGH (ref 0.00–0.07)
Basophils Absolute: 0 10*3/uL (ref 0.0–0.1)
Basophils Relative: 0 %
Eosinophils Absolute: 0.1 10*3/uL (ref 0.0–0.5)
Eosinophils Relative: 1 %
HCT: 34.7 % — ABNORMAL LOW (ref 39.0–52.0)
Hemoglobin: 12 g/dL — ABNORMAL LOW (ref 13.0–17.0)
Immature Granulocytes: 1 %
Lymphocytes Relative: 17 %
Lymphs Abs: 1.8 10*3/uL (ref 0.7–4.0)
MCH: 32.2 pg (ref 26.0–34.0)
MCHC: 34.6 g/dL (ref 30.0–36.0)
MCV: 93 fL (ref 80.0–100.0)
Monocytes Absolute: 0.7 10*3/uL (ref 0.1–1.0)
Monocytes Relative: 7 %
Neutro Abs: 7.9 10*3/uL — ABNORMAL HIGH (ref 1.7–7.7)
Neutrophils Relative %: 74 %
Platelets: 252 10*3/uL (ref 150–400)
RBC: 3.73 MIL/uL — ABNORMAL LOW (ref 4.22–5.81)
RDW: 14.1 % (ref 11.5–15.5)
WBC: 10.6 10*3/uL — ABNORMAL HIGH (ref 4.0–10.5)
nRBC: 0 % (ref 0.0–0.2)

## 2022-08-23 LAB — COMPREHENSIVE METABOLIC PANEL
ALT: 42 U/L (ref 0–44)
AST: 124 U/L — ABNORMAL HIGH (ref 15–41)
Albumin: 3.1 g/dL — ABNORMAL LOW (ref 3.5–5.0)
Alkaline Phosphatase: 148 U/L — ABNORMAL HIGH (ref 38–126)
Anion gap: 10 (ref 5–15)
BUN: <5 mg/dL — ABNORMAL LOW (ref 8–23)
CO2: 23 mmol/L (ref 22–32)
Calcium: 8.5 mg/dL — ABNORMAL LOW (ref 8.9–10.3)
Chloride: 95 mmol/L — ABNORMAL LOW (ref 98–111)
Creatinine, Ser: 0.78 mg/dL (ref 0.61–1.24)
GFR, Estimated: >60 mL/min (ref >60)
Glucose, Bld: 97 mg/dL (ref 70–99)
Potassium: 3.9 mmol/L (ref 3.5–5.1)
Sodium: 128 mmol/L — ABNORMAL LOW (ref 135–145)
Total Bilirubin: 1.4 mg/dL — ABNORMAL HIGH (ref 0.3–1.2)
Total Protein: 8.7 g/dL — ABNORMAL HIGH (ref 6.5–8.1)

## 2022-08-23 LAB — RESP PANEL BY RT-PCR (RSV, FLU A&B, COVID)  RVPGX2
Influenza A by PCR: NEGATIVE
Influenza B by PCR: NEGATIVE
Resp Syncytial Virus by PCR: NEGATIVE
SARS Coronavirus 2 by RT PCR: NEGATIVE

## 2022-08-23 LAB — MAGNESIUM: Magnesium: 1.9 mg/dL (ref 1.7–2.4)

## 2022-08-23 MED ORDER — DIPHENHYDRAMINE HCL 50 MG/ML IJ SOLN
25.0000 mg | Freq: Once | INTRAMUSCULAR | Status: AC
  Administered 2022-08-23: 25 mg via INTRAVENOUS
  Filled 2022-08-23: qty 1

## 2022-08-23 MED ORDER — PREDNISONE 10 MG PO TABS: 40.0000 mg | ORAL_TABLET | Freq: Every day | ORAL | 0 refills | Status: AC

## 2022-08-23 MED ORDER — LACTATED RINGERS IV BOLUS
500.0000 mL | Freq: Once | INTRAVENOUS | Status: AC
  Administered 2022-08-23: 500 mL via INTRAVENOUS

## 2022-08-23 MED ORDER — FAMOTIDINE 20 MG PO TABS: 20.0000 mg | ORAL_TABLET | Freq: Two times a day (BID) | ORAL | 0 refills | Status: AC

## 2022-08-23 MED ORDER — FAMOTIDINE IN NACL 20-0.9 MG/50ML-% IV SOLN
20.0000 mg | Freq: Once | INTRAVENOUS | Status: AC
  Administered 2022-08-23: 20 mg via INTRAVENOUS
  Filled 2022-08-23: qty 50

## 2022-08-23 MED ORDER — ACETAMINOPHEN 325 MG PO TABS
650.0000 mg | ORAL_TABLET | Freq: Once | ORAL | Status: DC
  Filled 2022-08-23: qty 2

## 2022-08-23 MED ORDER — METHYLPREDNISOLONE SODIUM SUCC 125 MG IJ SOLR
125.0000 mg | Freq: Once | INTRAMUSCULAR | Status: AC
  Administered 2022-08-23: 125 mg via INTRAVENOUS
  Filled 2022-08-23: qty 2

## 2022-08-23 MED ORDER — DIPHENHYDRAMINE HCL 25 MG PO TABS: 25.0000 mg | ORAL_TABLET | Freq: Four times a day (QID) | ORAL | 0 refills | Status: AC

## 2022-08-23 NOTE — ED Provider Notes (Signed)
[facility] Provider Note   CSN: [id number] Arrival date & time: 08/23/22  1133     History  Chief Complaint  Patient presents with   Allergic Reaction    Patrick Aguirre is a 62 y.o. male.   Allergic Reaction Patient presents for lip swelling.  Medical history includes HTN, chronic pain, arthritis, PAD, alcohol abuse.  Patient was in his normal state of health last night.  This morning, when he woke up at 6 AM, he noted swelling to his lips.  He has not noticed any other areas of swelling or any other symptoms.  Since onset, he feels that the swelling has mildly improved.  His lips do not feel as tight as they did when he woke up.  Patient does take lisinopril.  He has been taking it for the past 4 to 5 years.  Last dose was this morning.  Patient does continue to drink beer every day.     Home Medications Prior to Admission medications   Medication Sig Start Date End Date Taking? Authorizing Provider  aspirin EC 81 MG tablet Take 81 mg by mouth daily.   Yes [provider]  diphenhydrAMINE (BENADRYL) 25 MG tablet Take 1 tablet (25 mg total) by mouth every 6 (six) hours for 3 days. 08/23/22 08/26/22 Yes Godfrey Pick, MD  famotidine (PEPCID) 20 MG tablet Take 1 tablet (20 mg total) by mouth 2 (two) times daily for 3 days. 08/23/22 08/26/22 Yes Godfrey Pick, MD  predniSONE (DELTASONE) 10 MG tablet Take 4 tablets (40 mg total) by mouth daily for 3 days. 08/23/22 08/26/22 Yes Godfrey Pick, MD  rosuvastatin (CRESTOR) 5 MG tablet Take 1 tablet (5 mg total) by mouth daily. 08/05/21  Yes Cantwell, Celeste C, PA-C  amLODipine (NORVASC) 5 MG tablet Take 2 tablets (10 mg total) by mouth daily. 08/06/20 08/05/21  Cantwell, Anderson Malta C, PA-C      Allergies    Patient has no known allergies.    Review of Systems   Review of Systems  HENT:  Positive for facial swelling.   All other systems reviewed and are negative.   Physical Exam Updated Vital Signs BP  127/68   Pulse 91   Temp 100.1 F (37.8 C) (Oral)   Resp 17   Ht 5\' 11"  (1.803 m)   Wt 112.5 kg   SpO2 98%   BMI 34.59 kg/m  Physical Exam Vitals and nursing note reviewed.  Constitutional:      General: He is not in acute distress.    Appearance: He is well-developed. He is not ill-appearing, toxic-appearing or diaphoretic.  HENT:     Head: Normocephalic and atraumatic.     Right Ear: External ear normal.     Left Ear: External ear normal.     Nose: Nose normal.     Mouth/Throat:     Mouth: Mucous membranes are moist.     Comments: Swelling to lips.  No tongue swelling.  Oropharynx is widely patent. Eyes:     Extraocular Movements: Extraocular movements intact.     Conjunctiva/sclera: Conjunctivae normal.  Cardiovascular:     Rate and Rhythm: Regular rhythm. Tachycardia present.     Heart sounds: No murmur heard. Pulmonary:     Effort: Pulmonary effort is normal. No respiratory distress.     Breath sounds: No wheezing or rales.  Abdominal:     General: There is no distension.     Palpations: Abdomen is soft.  Tenderness: There is no abdominal tenderness.  Musculoskeletal:        General: No swelling. Normal range of motion.     Cervical back: Normal range of motion and neck supple.     Right lower leg: No edema.     Left lower leg: No edema.  Skin:    General: Skin is warm and dry.     Coloration: Skin is not jaundiced or pale.  Neurological:     General: No focal deficit present.     Mental Status: He is alert and oriented to person, place, and time.     Cranial Nerves: No cranial nerve deficit.     Sensory: No sensory deficit.     Motor: No weakness.     Coordination: Coordination normal.  Psychiatric:        Mood and Affect: Mood normal.        Behavior: Behavior normal.        Thought Content: Thought content normal.        Judgment: Judgment normal.     ED Results / Procedures / Treatments   Labs (all labs ordered are listed, but only abnormal  results are displayed) Labs Reviewed  COMPREHENSIVE METABOLIC PANEL - Abnormal; Notable for the following components:      Result Value   Sodium 128 (*)    Chloride 95 (*)    BUN <5 (*)    Calcium 8.5 (*)    Total Protein 8.7 (*)    Albumin 3.1 (*)    AST 124 (*)    Alkaline Phosphatase 148 (*)    Total Bilirubin 1.4 (*)    All other components within normal limits  CBC WITH DIFFERENTIAL/PLATELET - Abnormal; Notable for the following components:   WBC 10.6 (*)    RBC 3.73 (*)    Hemoglobin 12.0 (*)    HCT 34.7 (*)    Neutro Abs 7.9 (*)    Abs Immature Granulocytes 0.08 (*)    All other components within normal limits  RESP PANEL BY RT-PCR (RSV, FLU A&B, COVID)  [id number]  MAGNESIUM  URINALYSIS, ROUTINE W REFLEX MICROSCOPIC    EKG None  Radiology DG Chest Port 1 View  Result Date: 08/23/2022 CLINICAL DATA:  allergic rxn EXAM: PORTABLE CHEST 1 VIEW COMPARISON:  Radiograph 07/19/2016 FINDINGS: Borderline enlarged cardiac silhouette. There is no focal airspace consolidation. There is no pleural effusion or evidence of pneumothorax. Mild thoracic spondylosis. No acute osseous abnormality. IMPRESSION: Borderline enlarged cardiac silhouette. No focal airspace consolidation. Electronically Signed   By: Maurine Simmering M.D.   On: 08/23/2022 12:42    Procedures Procedures    Medications Ordered in ED Medications  acetaminophen (TYLENOL) tablet 650 mg (has no administration in time range)  diphenhydrAMINE (BENADRYL) injection 25 mg (25 mg Intravenous Given 08/23/22 1236)  methylPREDNISolone sodium succinate (SOLU-MEDROL) 125 mg/2 mL injection 125 mg (125 mg Intravenous Given 08/23/22 1236)  famotidine (PEPCID) IVPB 20 mg premix (0 mg Intravenous Stopped 08/23/22 1302)  lactated ringers bolus 500 mL (0 mLs Intravenous Stopped 08/23/22 1321)    ED Course/ Medical Decision Making/ A&P                             Medical Decision Making Amount and/or Complexity of Data Reviewed Labs:  ordered. Radiology: ordered.  Risk OTC drugs. Prescription drug management.   This patient presents to the ED for concern of lip swelling, this involves an  extensive number of treatment options, and is a complaint that carries with it a high risk of complications and morbidity.  The differential diagnosis includes angioedema, other allergic reaction   Co morbidities that complicate the patient evaluation  HTN, chronic pain, arthritis, PAD, alcohol abuse   Additional history obtained:  Additional history obtained from N/A External records from outside source obtained and reviewed including EMR   Lab Tests:  I Ordered, and personally interpreted labs.  The pertinent results include: Mild hyponatremia consistent with prior lab work, mild leukocytosis, baseline hemoglobin, no evidence of UTI   Imaging Studies ordered:  I ordered imaging studies including chest x-ray I independently visualized and interpreted imaging which showed no focal airspace consolidation I agree with the radiologist interpretation   Cardiac Monitoring: / EKG:  The patient was maintained on a cardiac monitor.  I personally viewed and interpreted the cardiac monitored which showed an underlying rhythm of: Sinus rhythm  Problem List / ED Course / Critical interventions / Medication management  Patient presents for swelling to lips.  This was noticed when he woke up this morning at 6 AM.  Vital signs on arrival are notable for low-grade temperature of 100.1 degrees and tachycardia.  He is well-appearing on exam.  Swelling involves the upper and lower lips.  There is no appreciable swelling in intraoral area.  Findings are consistent with angioedema.  Patient was advised to discontinue use of lisinopril.  Steroids, H1 blocker, H2 blocker medication were ordered.  Patient to be observed in the ED to ensure no worsening.  Given his tachycardia and temperature on arrival, will also assess for sources of infection.   Other lab work showed a mild leukocytosis, there is no clear source of infection.  Patient has no complaints other than new onset lip swelling.  Patient was observed in the ED for 4 hours.  He had no worsening of swelling.  He had no intraoral involvement.  He feels that his swelling continues to improve, although with swelling appears similar on exam.  He was advised to avoid lisinopril or any other ACE inhibitor medications.  He was prescribed ongoing steroids and histamine blockers.  He was advised to return to the ED for any worsening of swelling.  Patient was discharged in stable condition. I ordered medication including IV fluids for hydration; Solu-Medrol, Benadryl, Pepcid for angioedema Reevaluation of the patient after these medicines showed that the patient improved I have reviewed the patients home medicines and have made adjustments as needed   Social Determinants of Health:  Has access to outpatient care        Final Clinical Impression(s) / ED Diagnoses Final diagnoses:  Angioedema, initial encounter    Rx / DC Orders ED Discharge Orders          Ordered    predniSONE (DELTASONE) 10 MG tablet  Daily        08/23/22 1601    diphenhydrAMINE (BENADRYL) 25 MG tablet  Every 6 hours        08/23/22 1601    famotidine (PEPCID) 20 MG tablet  2 times daily        08/23/22 1601              Godfrey Pick, MD 08/23/22 [phone]

## 2022-08-23 NOTE — Discharge Instructions (Addendum)
Stop taking your lisinopril.  In the future, avoid any medications ending in "-pril".  There were medications sent to your pharmacy for ongoing treatment of allergic reaction.  Take for the next 3 days as prescribed.  Follow-up with your primary care doctor for ongoing management of your blood pressure.  Return to the emergency for any worsening swelling or any other new symptoms of concern.

## 2022-08-23 NOTE — ED Triage Notes (Signed)
Pt presents with angioedema that was present when he awoke at 06:30 this AM. Pt takes lisinopril. Airway is intact and pt has not difficulty breathing or swallowing.

## 2022-10-29 ENCOUNTER — Encounter (HOSPITAL_COMMUNITY): Payer: Self-pay

## 2022-10-29 ENCOUNTER — Other Ambulatory Visit: Payer: Self-pay

## 2022-10-29 ENCOUNTER — Emergency Department (HOSPITAL_COMMUNITY): Payer: Medicaid Other

## 2022-10-29 ENCOUNTER — Emergency Department (HOSPITAL_COMMUNITY)
Admission: EM | Admit: 2022-10-29 | Discharge: 2022-10-29 | Disposition: A | Discharge status: Home / Self Care | Payer: Medicaid Other | Attending: Emergency Medicine | Admitting: Emergency Medicine

## 2022-10-29 DIAGNOSIS — E876 Hypokalemia: Secondary | ICD-10-CM | POA: Insufficient documentation

## 2022-10-29 DIAGNOSIS — R600 Localized edema: Secondary | ICD-10-CM | POA: Diagnosis present

## 2022-10-29 DIAGNOSIS — I10 Essential (primary) hypertension: Secondary | ICD-10-CM | POA: Diagnosis not present

## 2022-10-29 LAB — COMPREHENSIVE METABOLIC PANEL
ALT: 25 U/L (ref 0–44)
AST: 81 U/L — ABNORMAL HIGH (ref 15–41)
Albumin: 2.6 g/dL — ABNORMAL LOW (ref 3.5–5.0)
Alkaline Phosphatase: 125 U/L (ref 38–126)
Anion gap: 10 (ref 5–15)
BUN: <5 mg/dL — ABNORMAL LOW (ref 8–23)
CO2: 22 mmol/L (ref 22–32)
Calcium: 8.4 mg/dL — ABNORMAL LOW (ref 8.9–10.3)
Chloride: 100 mmol/L (ref 98–111)
Creatinine, Ser: 0.66 mg/dL (ref 0.61–1.24)
GFR, Estimated: >60 mL/min (ref >60)
Glucose, Bld: 100 mg/dL — ABNORMAL HIGH (ref 70–99)
Potassium: 3.4 mmol/L — ABNORMAL LOW (ref 3.5–5.1)
Sodium: 132 mmol/L — ABNORMAL LOW (ref 135–145)
Total Bilirubin: 2.1 mg/dL — ABNORMAL HIGH (ref 0.3–1.2)
Total Protein: 8 g/dL (ref 6.5–8.1)

## 2022-10-29 LAB — CBC WITH DIFFERENTIAL/PLATELET
Abs Immature Granulocytes: 0.06 10*3/uL (ref 0.00–0.07)
Basophils Absolute: 0 10*3/uL (ref 0.0–0.1)
Basophils Relative: 0 %
Eosinophils Absolute: 0.1 10*3/uL (ref 0.0–0.5)
Eosinophils Relative: 0 %
HCT: 32.8 % — ABNORMAL LOW (ref 39.0–52.0)
Hemoglobin: 11.1 g/dL — ABNORMAL LOW (ref 13.0–17.0)
Immature Granulocytes: 1 %
Lymphocytes Relative: 23 %
Lymphs Abs: 2.8 10*3/uL (ref 0.7–4.0)
MCH: 33.3 pg (ref 26.0–34.0)
MCHC: 33.8 g/dL (ref 30.0–36.0)
MCV: 98.5 fL (ref 80.0–100.0)
Monocytes Absolute: 0.9 10*3/uL (ref 0.1–1.0)
Monocytes Relative: 7 %
Neutro Abs: 8.4 10*3/uL — ABNORMAL HIGH (ref 1.7–7.7)
Neutrophils Relative %: 69 %
Platelets: 237 10*3/uL (ref 150–400)
RBC: 3.33 MIL/uL — ABNORMAL LOW (ref 4.22–5.81)
RDW: 14.6 % (ref 11.5–15.5)
WBC: 12.2 10*3/uL — ABNORMAL HIGH (ref 4.0–10.5)
nRBC: 0 % (ref 0.0–0.2)

## 2022-10-29 LAB — BRAIN NATRIURETIC PEPTIDE: B Natriuretic Peptide: 165 pg/mL — ABNORMAL HIGH (ref 0.0–100.0)

## 2022-10-29 NOTE — ED Provider Notes (Signed)
[facility] EMERGENCY DEPARTMENT AT [facility] Provider Note   CSN: [id number] Arrival date & time: 10/29/22  0827     History  Chief Complaint  Patient presents with   Foot Swelling    Patrick Aguirre is a 62 y.o. male.  PMH of PAD, arthritis, hypertension and alcohol abuse.  Presents the ER for bilateral foot and ankle swelling for the past approximately 2 weeks.  He denies ever experiencing the past, denies any pain, no chest pain or shortness of breath, no cough, no fevers.  He does have history of stenting in his right common Iliac artery in 2019.  HPI     Home Medications Prior to Admission medications   Medication Sig Start Date End Date Taking? Authorizing Provider  amLODipine (NORVASC) 5 MG tablet Take 2 tablets (10 mg total) by mouth daily. 08/06/20 08/05/21  Cantwell, Sheyli Horwitz C, PA-C  aspirin EC 81 MG tablet Take 81 mg by mouth daily.    [provider]  diphenhydrAMINE (BENADRYL) 25 MG tablet Take 1 tablet (25 mg total) by mouth every 6 (six) hours for 3 days. 08/23/22 08/26/22  Gloris Manchester, MD  famotidine (PEPCID) 20 MG tablet Take 1 tablet (20 mg total) by mouth 2 (two) times daily for 3 days. 08/23/22 08/26/22  Gloris Manchester, MD  rosuvastatin (CRESTOR) 5 MG tablet Take 1 tablet (5 mg total) by mouth daily. 08/05/21   Cantwell, Graysen Woodyard C, PA-C      Allergies    Lisinopril    Review of Systems   Review of Systems  Physical Exam Updated Vital Signs BP 135/64 (BP Location: Left Arm)   Pulse 84   Temp 97.9 F (36.6 C) (Oral)   Resp 20   Ht 5\' 11"  (1.803 m)   Wt 108.9 kg   SpO2 100%   BMI 33.47 kg/m  Physical Exam Vitals and nursing note reviewed.  Constitutional:      General: He is not in acute distress.    Appearance: He is well-developed.  HENT:     Head: Normocephalic and atraumatic.     Mouth/Throat:     Mouth: Mucous membranes are moist.  Eyes:     Conjunctiva/sclera: Conjunctivae normal.  Cardiovascular:     Rate and Rhythm: Normal  rate and regular rhythm.     Heart sounds: No murmur heard. Pulmonary:     Effort: Pulmonary effort is normal. No respiratory distress.     Breath sounds: Normal breath sounds.  Abdominal:     Palpations: Abdomen is soft.     Tenderness: There is no abdominal tenderness.  Musculoskeletal:        General: No swelling.     Cervical back: Neck supple.     Comments: 1+ edema to bilateral feet and ankles.  No calf swelling or tenderness.  Skin:    General: Skin is warm and dry.     Capillary Refill: Capillary refill takes less than 2 seconds.  Neurological:     General: No focal deficit present.     Mental Status: He is alert and oriented to person, place, and time.     Comments: Ambulates with a cane  Psychiatric:        Mood and Affect: Mood normal.     ED Results / Procedures / Treatments   Labs (all labs ordered are listed, but only abnormal results are displayed) Labs Reviewed  CBC WITH DIFFERENTIAL/PLATELET - Abnormal; Notable for the following components:      Result  Value   WBC 12.2 (*)    RBC 3.33 (*)    Hemoglobin 11.1 (*)    HCT 32.8 (*)    Neutro Abs 8.4 (*)    All other components within normal limits  COMPREHENSIVE METABOLIC PANEL - Abnormal; Notable for the following components:   Sodium 132 (*)    Potassium 3.4 (*)    Glucose, Bld 100 (*)    BUN <5 (*)    Calcium 8.4 (*)    Albumin 2.6 (*)    AST 81 (*)    Total Bilirubin 2.1 (*)    All other components within normal limits  BRAIN NATRIURETIC PEPTIDE - Abnormal; Notable for the following components:   B Natriuretic Peptide 165.0 (*)    All other components within normal limits    EKG None  Radiology No results found.  Procedures Procedures    Medications Ordered in ED Medications - No data to display  ED Course/ Medical Decision Making/ A&P                             Medical Decision Making This patient presents to the ED for concern of BLE edema X 2 weeks, this involves an extensive  number of treatment options, and is a complaint that carries with it a high risk of complications and morbidity.  The differential diagnosis includes peripheral edema, renal failure, lymphedema, CHF, DVT, side effect of calcium channel blocker, other   Co morbidities that complicate the patient evaluation  PAD, HTN    Additional history obtained:  Additional history obtained from EMR External records from outside source obtained and reviewed including cardio notes, recent ED visit for angioedema  Radiology: X-ray shows no pulmonary edema or infiltrate, interpreted independently by me and I agree with radiology read  Lab Tests:  I Ordered, and personally interpreted labs.  The pertinent results include:  BNP slightly elevated at 165, no prior for comparison  CBC shows mild leukocytosis but not in the setting of any other infectious symptoms,   CMP shows mild hyponatremia but patient tends to be low, possibly secondary to beer potomania as he does drink 4-5 beers a day.  Mild hypokalemia, advised patient on dietary intake, normal renal function, AST slightly elevated but better than previous and bili slightly elevated, has been elevated previously, no abdominal pain advised on PCP follow-up.     Problem List / ED Course / Critical interventions / Medication management  LE edema-pt with peripheral edema, swelling is symmetric with no calf swelling or pain, I do not feel DVT is likely.  Initially could be related to the amlodipine but this is not a new medication for him.  Discussed supportive care with Elevation and compression socks  and close PCP follow-up. I have reviewed the patients home medicines and have made adjustments as needed       Amount and/or Complexity of Data Reviewed External Data Reviewed: labs, radiology and notes.    Details: Prior cardiology notes EF normal in 2022 on echo Labs: ordered. Radiology: ordered.           Final Clinical  Impression(s) / ED Diagnoses Final diagnoses:  Peripheral edema    Rx / DC Orders ED Discharge Orders     None         Josem Kaufmann 10/29/22 1146    Eber Hong, MD 10/29/22 1714

## 2022-10-29 NOTE — Discharge Instructions (Signed)
It was a pleasure taking care of you today.  You were seen for swelling in your feet and ankles.  Please pick up compression stocks that you can get at any pharmacy, wearing these can help decrease the swelling.  You should work on decreasing your intake of alcohol, though I do not advise stopping suddenly without the care of a medical professional as this can be dangerous.  Your sodium was slightly low today which could be due to the beer intake and your liver tests were slightly high.  Follow-up closely with your primary care doctor for recheck.  Please come back to the ER if you have fever, pain, shortness of breath or chest pain or any other new or worsening symptoms.

## 2022-10-29 NOTE — ED Triage Notes (Signed)
Pt reports increased bilateral foot swelling x 2 weeks, denies any SHOB.

## 2023-06-04 ENCOUNTER — Encounter (HOSPITAL_COMMUNITY): Payer: Self-pay

## 2023-06-04 ENCOUNTER — Other Ambulatory Visit: Payer: Self-pay

## 2023-06-04 ENCOUNTER — Emergency Department (HOSPITAL_COMMUNITY)
Admission: EM | Admit: 2023-06-04 | Discharge: 2023-06-04 | Disposition: A | Discharge status: Home / Self Care | Payer: Medicaid Other | Attending: Emergency Medicine | Admitting: Emergency Medicine

## 2023-06-04 DIAGNOSIS — R040 Epistaxis: Secondary | ICD-10-CM | POA: Diagnosis present

## 2023-06-04 DIAGNOSIS — I10 Essential (primary) hypertension: Secondary | ICD-10-CM | POA: Insufficient documentation

## 2023-06-04 DIAGNOSIS — Z7982 Long term (current) use of aspirin: Secondary | ICD-10-CM | POA: Diagnosis not present

## 2023-06-04 DIAGNOSIS — Z79899 Other long term (current) drug therapy: Secondary | ICD-10-CM | POA: Diagnosis not present

## 2023-06-04 HISTORY — DX: Alcohol abuse, uncomplicated: F10.10

## 2023-06-04 MED ORDER — OXYMETAZOLINE HCL 0.05 % NA SOLN
1.0000 | Freq: Once | NASAL | Status: AC
  Administered 2023-06-04: 1 via NASAL
  Filled 2023-06-04: qty 30

## 2023-06-04 MED ORDER — OXYMETAZOLINE HCL 0.05 % NA SOLN: 1.0000 | Freq: Two times a day (BID) | NASAL | 0 refills | Status: AC

## 2023-06-04 NOTE — ED Provider Notes (Signed)
 [place] EMERGENCY DEPARTMENT AT [facility] Provider Note   CSN: [id number] Arrival date & time: 06/04/23  0915     History  Chief Complaint  Patient presents with   Epistaxis    Patrick Aguirre is a 63 y.o. male with EtOH abuse presents with 4 to 5 days of bilateral nosebleeding.  Appears to be worse on left.  States it has been, coming and going.  Denies any nasal trauma.  Denies bleeding from anywhere else.  Reports that he takes an aspirin  regularly.  Feels well otherwise.   Epistaxis  Past Medical History:  Diagnosis Date   Alcohol abuse    Arthritis    Chronic back pain    Environmental allergies    Hypertension        Home Medications Prior to Admission medications   Medication Sig Start Date End Date Taking? Authorizing Provider  oxymetazoline  (AFRIN) 0.05 % nasal spray Place 1 spray into both nostrils 2 (two) times daily. 06/04/23  Yes Donnajean Lynwood DEL, PA-C  amLODipine  (NORVASC ) 5 MG tablet Take 2 tablets (10 mg total) by mouth daily. 08/06/20 08/05/21  Cantwell, Celeste C, PA-C  aspirin  EC 81 MG tablet Take 81 mg by mouth daily.    [provider]  diphenhydrAMINE  (BENADRYL ) 25 MG tablet Take 1 tablet (25 mg total) by mouth every 6 (six) hours for 3 days. 08/23/22 08/26/22  Melvenia Motto, MD  famotidine  (PEPCID ) 20 MG tablet Take 1 tablet (20 mg total) by mouth 2 (two) times daily for 3 days. 08/23/22 08/26/22  Melvenia Motto, MD  rosuvastatin  (CRESTOR ) 5 MG tablet Take 1 tablet (5 mg total) by mouth daily. 08/05/21   Cantwell, Celeste C, PA-C      Allergies    Lisinopril     Review of Systems   Review of Systems  HENT:  Positive for nosebleeds.     Physical Exam Updated Vital Signs BP (!) 107/59   Pulse 85   Temp 97.8 F (36.6 C) (Oral)   Resp 17   Ht 5' 11 (1.803 m)   Wt 108.9 kg   SpO2 90%   BMI 33.47 kg/m  Physical Exam Vitals and nursing note reviewed.  Constitutional:      General: He is not in acute distress.    Appearance: He is  well-developed.  HENT:     Head: Normocephalic and atraumatic.     Nose:     Comments: Bilateral nares with dried blood, no significant active bleeders, septum midline Eyes:     General: Scleral icterus present.  Cardiovascular:     Pulses: Normal pulses.  Pulmonary:     Effort: Pulmonary effort is normal. No respiratory distress.     Breath sounds: Normal breath sounds.  Musculoskeletal:     Cervical back: Neck supple.  Skin:    General: Skin is warm and dry.     Capillary Refill: Capillary refill takes less than 2 seconds.  Neurological:     Mental Status: He is alert.  Psychiatric:        Mood and Affect: Mood normal.     ED Results / Procedures / Treatments   Labs (all labs ordered are listed, but only abnormal results are displayed) Labs Reviewed - No data to display  EKG None  Radiology No results found.  Procedures Procedures    Medications Ordered in ED Medications  oxymetazoline  (AFRIN) 0.05 % nasal spray 1 spray (1 spray Each Nare Given 06/04/23 0942)    ED  Course/ Medical Decision Making/ A&P                                 Medical Decision Making Risk OTC drugs.   This patient presents to the ED with chief complaint(s) of epistaxis.  The complaint involves an extensive differential diagnosis and also carries with it a high risk of complications and morbidity.   pertinent past medical history as listed in HPI  The differential diagnosis includes  Uncomplicated epistaxis, coagulopathy, hemophilia, nasal trauma The initial plan is to  Will apply Afrin and pressure Additional history obtained:  Records reviewed previous admission documents  Initial Assessment:   Patient is hemodynamically stable, nontoxic-appearing presenting with 4 days of intermittent epistaxis.  Bleeding appears to be controlled in triage and throughout exam.  He does appear to have some dried blood in bilateral nares without active bleeders visualized.  No evidence of other  bleeding.  Low suspicion for coagulopathy or hemophilia.  Although he does have a history of alcohol abuse with scleral icterus on exam, do not feel the need to obtain lab work as his bleeding appears to be under control.   Independent ECG interpretation:  None  Independent labs interpretation:  The following labs were independently interpreted:  none  Independent visualization and interpretation of imaging: none  Treatment and Reassessment: Patient given Afrin following first assessment and told to maintain pressure on his nose for 15 minutes. Upon reassessment patient denies any bleeding.  Discussed discharge plan with patient and he is agreeable.  Consultations obtained:   none  Disposition:   Patient discharged home.  Prescription for Afrin sent into pharmacy.  Told to hold aspirin  for the next 2 days.  Referral provided for ENT should he continue to have symptoms.  Encouraged to use humidifier around the house and avoid digital trauma. The patient has been appropriately medically screened and/or stabilized in the ED. I have low suspicion for any other emergent medical condition which would require further screening, evaluation or treatment in the ED or require inpatient management. At time of discharge the patient is hemodynamically stable and in no acute distress. I have discussed work-up results and diagnosis with patient and answered all questions. Patient is agreeable with discharge plan. We discussed strict return precautions for returning to the emergency department and they verbalized understanding.     Social Determinants of Health:   Patient's impaired access to primary care  increases the complexity of managing their presentation  This note was dictated with voice recognition software.  Despite best efforts at proofreading, errors may have occurred which can change the documentation meaning.          Final Clinical Impression(s) / ED Diagnoses Final diagnoses:   Epistaxis    Rx / DC Orders ED Discharge Orders          Ordered    oxymetazoline  (AFRIN) 0.05 % nasal spray  2 times daily        06/04/23 1103              Donnajean Lynwood VEAR DEVONNA 06/04/23 1107    Suzette Pac, MD 06/05/23 1154

## 2023-06-04 NOTE — Discharge Instructions (Addendum)
 It was a pleasure taking care of you today.  You were evaluated in the emergency room for nosebleed.  You were given Afrin to help stop the bleed.  Refill of this was sent to your pharmacy.  Please take this as instructed and if you have recurrent bleeds, please keep pressure on your nose as you did here in the ED.  Please hold your aspirin  for the next 2 days.  Additionally you are provided a ENT referral should you have persistent symptoms.  If you have persistent bleeding with dizziness or bleeding from elsewhere as well please return to the emergency room.

## 2023-06-04 NOTE — ED Triage Notes (Signed)
 Pt arrived POV with c/o epistaxis x 5 days. Family stated pt has had episodes tid in those 5 days, 15 mins of ice has been working. Bilateral nostrils are bleeding and bleeding is under control at this time. Pt has had a cold x 2 weeks. Pt drinks three 40 oz daily and a case of beer. He has not had any in 24hrs. Pt denies any coughing or vomiting up blood.

## 2023-08-24 DIAGNOSIS — M79675 Pain in left toe(s): Secondary | ICD-10-CM | POA: Diagnosis not present

## 2023-08-24 DIAGNOSIS — L110 Acquired keratosis follicularis: Secondary | ICD-10-CM | POA: Diagnosis not present

## 2023-08-24 DIAGNOSIS — M79671 Pain in right foot: Secondary | ICD-10-CM | POA: Diagnosis not present

## 2023-08-24 DIAGNOSIS — M79674 Pain in right toe(s): Secondary | ICD-10-CM | POA: Diagnosis not present

## 2023-08-24 DIAGNOSIS — S93331A Other subluxation of right foot, initial encounter: Secondary | ICD-10-CM | POA: Diagnosis not present

## 2023-08-24 DIAGNOSIS — S93332A Other subluxation of left foot, initial encounter: Secondary | ICD-10-CM | POA: Diagnosis not present

## 2023-08-24 DIAGNOSIS — M79672 Pain in left foot: Secondary | ICD-10-CM | POA: Diagnosis not present

## 2023-08-24 DIAGNOSIS — M779 Enthesopathy, unspecified: Secondary | ICD-10-CM | POA: Diagnosis not present

## 2023-08-24 DIAGNOSIS — I739 Peripheral vascular disease, unspecified: Secondary | ICD-10-CM | POA: Diagnosis not present

## 2023-08-29 DIAGNOSIS — F17210 Nicotine dependence, cigarettes, uncomplicated: Secondary | ICD-10-CM | POA: Diagnosis not present

## 2023-08-29 DIAGNOSIS — R748 Abnormal levels of other serum enzymes: Secondary | ICD-10-CM | POA: Diagnosis not present

## 2023-08-29 DIAGNOSIS — R052 Subacute cough: Secondary | ICD-10-CM | POA: Diagnosis not present

## 2023-08-29 DIAGNOSIS — I10 Essential (primary) hypertension: Secondary | ICD-10-CM | POA: Diagnosis not present

## 2023-08-29 DIAGNOSIS — I739 Peripheral vascular disease, unspecified: Secondary | ICD-10-CM | POA: Diagnosis not present

## 2023-08-29 DIAGNOSIS — Z131 Encounter for screening for diabetes mellitus: Secondary | ICD-10-CM | POA: Diagnosis not present

## 2023-08-29 DIAGNOSIS — R600 Localized edema: Secondary | ICD-10-CM | POA: Diagnosis not present

## 2023-09-07 ENCOUNTER — Ambulatory Visit (HOSPITAL_COMMUNITY)
Admission: RE | Admit: 2023-09-07 | Discharge: 2023-09-07 | Disposition: A | Discharge status: Home / Self Care | Source: Ambulatory Visit | Attending: Family Medicine | Admitting: Family Medicine

## 2023-09-07 ENCOUNTER — Other Ambulatory Visit (HOSPITAL_COMMUNITY)
Admission: RE | Admit: 2023-09-07 | Discharge: 2023-09-07 | Disposition: A | Discharge status: Home / Self Care | Source: Ambulatory Visit | Attending: Family Medicine | Admitting: Family Medicine

## 2023-09-07 ENCOUNTER — Other Ambulatory Visit (HOSPITAL_COMMUNITY): Payer: Self-pay | Admitting: Family Medicine

## 2023-09-07 DIAGNOSIS — I739 Peripheral vascular disease, unspecified: Secondary | ICD-10-CM | POA: Insufficient documentation

## 2023-09-07 DIAGNOSIS — R059 Cough, unspecified: Secondary | ICD-10-CM | POA: Diagnosis not present

## 2023-09-07 DIAGNOSIS — Z131 Encounter for screening for diabetes mellitus: Secondary | ICD-10-CM | POA: Insufficient documentation

## 2023-09-07 DIAGNOSIS — J181 Lobar pneumonia, unspecified organism: Secondary | ICD-10-CM | POA: Insufficient documentation

## 2023-09-07 DIAGNOSIS — R748 Abnormal levels of other serum enzymes: Secondary | ICD-10-CM | POA: Insufficient documentation

## 2023-09-07 DIAGNOSIS — R052 Subacute cough: Secondary | ICD-10-CM | POA: Diagnosis not present

## 2023-09-07 DIAGNOSIS — Z125 Encounter for screening for malignant neoplasm of prostate: Secondary | ICD-10-CM | POA: Diagnosis not present

## 2023-09-07 DIAGNOSIS — I10 Essential (primary) hypertension: Secondary | ICD-10-CM | POA: Diagnosis not present

## 2023-09-07 LAB — COMPREHENSIVE METABOLIC PANEL WITH GFR
ALT: 25 U/L (ref 0–44)
AST: 57 U/L — ABNORMAL HIGH (ref 15–41)
Albumin: 2.6 g/dL — ABNORMAL LOW (ref 3.5–5.0)
Alkaline Phosphatase: 99 U/L (ref 38–126)
Anion gap: 10 (ref 5–15)
BUN: 12 mg/dL (ref 8–23)
CO2: 25 mmol/L (ref 22–32)
Calcium: 8.9 mg/dL (ref 8.9–10.3)
Chloride: 85 mmol/L — ABNORMAL LOW (ref 98–111)
Creatinine, Ser: 1.11 mg/dL (ref 0.61–1.24)
GFR, Estimated: >60 mL/min (ref >60)
Glucose, Bld: 115 mg/dL — ABNORMAL HIGH (ref 70–99)
Potassium: 2.9 mmol/L — ABNORMAL LOW (ref 3.5–5.1)
Sodium: 120 mmol/L — ABNORMAL LOW (ref 135–145)
Total Bilirubin: 4.1 mg/dL — ABNORMAL HIGH (ref 0.0–1.2)
Total Protein: 8.7 g/dL — ABNORMAL HIGH (ref 6.5–8.1)

## 2023-09-07 LAB — CBC WITH DIFFERENTIAL/PLATELET
Abs Immature Granulocytes: 0.07 10*3/uL (ref 0.00–0.07)
Basophils Absolute: 0.1 10*3/uL (ref 0.0–0.1)
Basophils Relative: 1 %
Eosinophils Absolute: 0 10*3/uL (ref 0.0–0.5)
Eosinophils Relative: 0 %
HCT: 34.3 % — ABNORMAL LOW (ref 39.0–52.0)
Hemoglobin: 12.5 g/dL — ABNORMAL LOW (ref 13.0–17.0)
Immature Granulocytes: 1 %
Lymphocytes Relative: 27 %
Lymphs Abs: 2.8 10*3/uL (ref 0.7–4.0)
MCH: 32.9 pg (ref 26.0–34.0)
MCHC: 36.4 g/dL — ABNORMAL HIGH (ref 30.0–36.0)
MCV: 90.3 fL (ref 80.0–100.0)
Monocytes Absolute: 0.8 10*3/uL (ref 0.1–1.0)
Monocytes Relative: 7 %
Neutro Abs: 6.7 10*3/uL (ref 1.7–7.7)
Neutrophils Relative %: 64 %
Platelets: 172 10*3/uL (ref 150–400)
RBC: 3.8 MIL/uL — ABNORMAL LOW (ref 4.22–5.81)
RDW: 14.2 % (ref 11.5–15.5)
WBC: 10.4 10*3/uL (ref 4.0–10.5)
nRBC: 0.2 % (ref 0.0–0.2)

## 2023-09-07 LAB — LIPID PANEL
Cholesterol: 125 mg/dL (ref 0–200)
HDL: 33 mg/dL — ABNORMAL LOW (ref >40)
LDL Cholesterol: 72 mg/dL (ref 0–99)
Total CHOL/HDL Ratio: 3.8 ratio
Triglycerides: 101 mg/dL (ref <150)
VLDL: 20 mg/dL (ref 0–40)

## 2023-09-07 LAB — HEMOGLOBIN A1C
Hgb A1c MFr Bld: 4.5 % — ABNORMAL LOW (ref 4.8–5.6)
Mean Plasma Glucose: 82.45 mg/dL

## 2023-09-07 LAB — GAMMA GT: GGT: 124 U/L — ABNORMAL HIGH (ref 7–50)

## 2023-09-07 LAB — PSA: Prostatic Specific Antigen: 0.58 ng/mL (ref 0.00–4.00)

## 2023-09-07 LAB — TSH: TSH: 4.072 u[IU]/mL (ref 0.350–4.500)

## 2023-09-27 DIAGNOSIS — R059 Cough, unspecified: Secondary | ICD-10-CM | POA: Diagnosis not present

## 2023-09-27 DIAGNOSIS — F17210 Nicotine dependence, cigarettes, uncomplicated: Secondary | ICD-10-CM | POA: Diagnosis not present

## 2023-10-03 DIAGNOSIS — A419 Sepsis, unspecified organism: Secondary | ICD-10-CM | POA: Diagnosis not present

## 2023-10-03 DIAGNOSIS — R188 Other ascites: Secondary | ICD-10-CM | POA: Diagnosis not present

## 2023-10-03 DIAGNOSIS — K7682 Hepatic encephalopathy: Secondary | ICD-10-CM | POA: Diagnosis not present

## 2023-10-03 DIAGNOSIS — Z9911 Dependence on respirator [ventilator] status: Secondary | ICD-10-CM | POA: Diagnosis not present

## 2023-10-03 DIAGNOSIS — E785 Hyperlipidemia, unspecified: Secondary | ICD-10-CM | POA: Diagnosis not present

## 2023-10-03 DIAGNOSIS — Z452 Encounter for adjustment and management of vascular access device: Secondary | ICD-10-CM | POA: Diagnosis not present

## 2023-10-03 DIAGNOSIS — G9341 Metabolic encephalopathy: Secondary | ICD-10-CM | POA: Diagnosis not present

## 2023-10-03 DIAGNOSIS — R7401 Elevation of levels of liver transaminase levels: Secondary | ICD-10-CM | POA: Diagnosis not present

## 2023-10-03 DIAGNOSIS — N179 Acute kidney failure, unspecified: Secondary | ICD-10-CM | POA: Diagnosis not present

## 2023-10-03 DIAGNOSIS — K55029 Acute infarction of small intestine, extent unspecified: Secondary | ICD-10-CM | POA: Diagnosis not present

## 2023-10-03 DIAGNOSIS — J9601 Acute respiratory failure with hypoxia: Secondary | ICD-10-CM | POA: Diagnosis not present

## 2023-10-03 DIAGNOSIS — K55042 Diffuse acute infarction of large intestine: Secondary | ICD-10-CM | POA: Diagnosis not present

## 2023-10-03 DIAGNOSIS — K668 Other specified disorders of peritoneum: Secondary | ICD-10-CM | POA: Diagnosis not present

## 2023-10-03 DIAGNOSIS — I739 Peripheral vascular disease, unspecified: Secondary | ICD-10-CM | POA: Diagnosis not present

## 2023-10-03 DIAGNOSIS — E872 Acidosis, unspecified: Secondary | ICD-10-CM | POA: Diagnosis not present

## 2023-10-03 DIAGNOSIS — R6521 Severe sepsis with septic shock: Secondary | ICD-10-CM | POA: Diagnosis not present

## 2023-10-03 DIAGNOSIS — K6389 Other specified diseases of intestine: Secondary | ICD-10-CM | POA: Diagnosis not present

## 2023-10-03 DIAGNOSIS — D72829 Elevated white blood cell count, unspecified: Secondary | ICD-10-CM | POA: Diagnosis not present

## 2023-10-03 DIAGNOSIS — Z66 Do not resuscitate: Secondary | ICD-10-CM | POA: Diagnosis not present

## 2023-10-03 DIAGNOSIS — D684 Acquired coagulation factor deficiency: Secondary | ICD-10-CM | POA: Diagnosis not present

## 2023-10-03 DIAGNOSIS — Z4682 Encounter for fitting and adjustment of non-vascular catheter: Secondary | ICD-10-CM | POA: Diagnosis not present

## 2023-10-03 DIAGNOSIS — I119 Hypertensive heart disease without heart failure: Secondary | ICD-10-CM | POA: Diagnosis not present

## 2023-10-03 DIAGNOSIS — K766 Portal hypertension: Secondary | ICD-10-CM | POA: Diagnosis not present

## 2023-10-03 DIAGNOSIS — I96 Gangrene, not elsewhere classified: Secondary | ICD-10-CM | POA: Diagnosis not present

## 2023-10-03 DIAGNOSIS — R531 Weakness: Secondary | ICD-10-CM | POA: Diagnosis not present

## 2023-10-03 DIAGNOSIS — R579 Shock, unspecified: Secondary | ICD-10-CM | POA: Diagnosis not present

## 2023-10-03 DIAGNOSIS — K7200 Acute and subacute hepatic failure without coma: Secondary | ICD-10-CM | POA: Diagnosis not present

## 2023-10-03 DIAGNOSIS — E162 Hypoglycemia, unspecified: Secondary | ICD-10-CM | POA: Diagnosis not present

## 2023-10-03 DIAGNOSIS — J9811 Atelectasis: Secondary | ICD-10-CM | POA: Diagnosis not present

## 2023-10-03 DIAGNOSIS — K559 Vascular disorder of intestine, unspecified: Secondary | ICD-10-CM | POA: Diagnosis not present

## 2023-10-03 DIAGNOSIS — R0602 Shortness of breath: Secondary | ICD-10-CM | POA: Diagnosis not present

## 2023-10-03 DIAGNOSIS — Z87891 Personal history of nicotine dependence: Secondary | ICD-10-CM | POA: Diagnosis not present

## 2023-10-03 DIAGNOSIS — K55022 Diffuse acute infarction of small intestine: Secondary | ICD-10-CM | POA: Diagnosis not present

## 2023-10-03 DIAGNOSIS — J918 Pleural effusion in other conditions classified elsewhere: Secondary | ICD-10-CM | POA: Diagnosis not present

## 2023-10-03 DIAGNOSIS — E876 Hypokalemia: Secondary | ICD-10-CM | POA: Diagnosis not present

## 2023-10-03 DIAGNOSIS — K429 Umbilical hernia without obstruction or gangrene: Secondary | ICD-10-CM | POA: Diagnosis not present

## 2023-10-03 DIAGNOSIS — E871 Hypo-osmolality and hyponatremia: Secondary | ICD-10-CM | POA: Diagnosis not present

## 2023-10-03 DIAGNOSIS — R918 Other nonspecific abnormal finding of lung field: Secondary | ICD-10-CM | POA: Diagnosis not present

## 2023-10-03 DIAGNOSIS — K55049 Acute infarction of large intestine, extent unspecified: Secondary | ICD-10-CM | POA: Diagnosis not present

## 2023-10-03 DIAGNOSIS — K631 Perforation of intestine (nontraumatic): Secondary | ICD-10-CM | POA: Diagnosis not present

## 2023-10-03 DIAGNOSIS — I10 Essential (primary) hypertension: Secondary | ICD-10-CM | POA: Diagnosis not present

## 2023-10-03 DIAGNOSIS — K8020 Calculus of gallbladder without cholecystitis without obstruction: Secondary | ICD-10-CM | POA: Diagnosis not present

## 2023-10-03 DIAGNOSIS — J90 Pleural effusion, not elsewhere classified: Secondary | ICD-10-CM | POA: Diagnosis not present

## 2023-10-03 DIAGNOSIS — D689 Coagulation defect, unspecified: Secondary | ICD-10-CM | POA: Diagnosis not present

## 2023-10-03 DIAGNOSIS — D649 Anemia, unspecified: Secondary | ICD-10-CM | POA: Diagnosis not present

## 2023-10-03 DIAGNOSIS — K7290 Hepatic failure, unspecified without coma: Secondary | ICD-10-CM | POA: Diagnosis not present

## 2023-10-03 DIAGNOSIS — Z7189 Other specified counseling: Secondary | ICD-10-CM | POA: Diagnosis not present

## 2023-10-03 DIAGNOSIS — F17210 Nicotine dependence, cigarettes, uncomplicated: Secondary | ICD-10-CM | POA: Diagnosis not present

## 2023-10-03 DIAGNOSIS — R9431 Abnormal electrocardiogram [ECG] [EKG]: Secondary | ICD-10-CM | POA: Diagnosis not present

## 2023-10-03 DIAGNOSIS — K659 Peritonitis, unspecified: Secondary | ICD-10-CM | POA: Diagnosis not present

## 2023-10-03 DIAGNOSIS — R0989 Other specified symptoms and signs involving the circulatory and respiratory systems: Secondary | ICD-10-CM | POA: Diagnosis not present

## 2023-10-03 DIAGNOSIS — I491 Atrial premature depolarization: Secondary | ICD-10-CM | POA: Diagnosis not present

## 2023-10-04 DIAGNOSIS — R918 Other nonspecific abnormal finding of lung field: Secondary | ICD-10-CM | POA: Diagnosis not present

## 2023-10-04 DIAGNOSIS — J90 Pleural effusion, not elsewhere classified: Secondary | ICD-10-CM | POA: Diagnosis not present

## 2023-10-22 DEATH — deceased
